# Patient Record
Sex: Female | Born: 1980 | Race: Black or African American | Hispanic: No | Marital: Single | Smoking: Current some day smoker
Health system: Southern US, Community
[De-identification: ages and names within clinical notes are randomized; demographics above are authoritative.]

## PROBLEM LIST (undated history)

## (undated) DIAGNOSIS — J189 Pneumonia, unspecified organism: Secondary | ICD-10-CM

## (undated) HISTORY — PX: FOOT SURGERY: SHX648

---
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
## 2001-12-09 ENCOUNTER — Emergency Department (HOSPITAL_COMMUNITY): Admission: EM | Admit: 2001-12-09 | Discharge: 2001-12-09 | Payer: Self-pay | Admitting: Emergency Medicine

## 2001-12-11 ENCOUNTER — Inpatient Hospital Stay (HOSPITAL_COMMUNITY): Admission: EM | Admit: 2001-12-11 | Discharge: 2001-12-15 | Payer: Self-pay | Admitting: Physical Therapy

## 2002-01-15 ENCOUNTER — Emergency Department (HOSPITAL_COMMUNITY): Admission: EM | Admit: 2002-01-15 | Discharge: 2002-01-15 | Payer: Self-pay | Admitting: Emergency Medicine

## 2002-01-15 ENCOUNTER — Encounter: Payer: Self-pay | Admitting: Emergency Medicine

## 2002-01-19 ENCOUNTER — Inpatient Hospital Stay (HOSPITAL_COMMUNITY): Admission: EM | Admit: 2002-01-19 | Discharge: 2002-01-21 | Payer: Self-pay | Admitting: *Deleted

## 2002-04-14 ENCOUNTER — Emergency Department (HOSPITAL_COMMUNITY): Admission: EM | Admit: 2002-04-14 | Discharge: 2002-04-14 | Payer: Self-pay | Admitting: Emergency Medicine

## 2002-04-17 ENCOUNTER — Inpatient Hospital Stay (HOSPITAL_COMMUNITY): Admission: EM | Admit: 2002-04-17 | Discharge: 2002-04-18 | Payer: Self-pay | Admitting: Emergency Medicine

## 2002-04-19 ENCOUNTER — Emergency Department (HOSPITAL_COMMUNITY): Admission: EM | Admit: 2002-04-19 | Discharge: 2002-04-19 | Payer: Self-pay | Admitting: Emergency Medicine

## 2002-04-25 ENCOUNTER — Encounter: Admission: RE | Admit: 2002-04-25 | Discharge: 2002-04-25 | Payer: Self-pay | Admitting: Internal Medicine

## 2003-03-18 ENCOUNTER — Other Ambulatory Visit: Admission: RE | Admit: 2003-03-18 | Discharge: 2003-03-18 | Payer: Self-pay | Admitting: *Deleted

## 2003-06-08 ENCOUNTER — Encounter: Payer: Self-pay | Admitting: Internal Medicine

## 2003-06-08 ENCOUNTER — Inpatient Hospital Stay (HOSPITAL_COMMUNITY): Admission: EM | Admit: 2003-06-08 | Discharge: 2003-06-10 | Payer: Self-pay | Admitting: Emergency Medicine

## 2003-11-04 ENCOUNTER — Emergency Department (HOSPITAL_COMMUNITY): Admission: EM | Admit: 2003-11-04 | Discharge: 2003-11-05 | Payer: Self-pay | Admitting: Emergency Medicine

## 2003-11-21 ENCOUNTER — Encounter (INDEPENDENT_AMBULATORY_CARE_PROVIDER_SITE_OTHER): Payer: Self-pay | Admitting: Specialist

## 2003-11-21 ENCOUNTER — Ambulatory Visit (HOSPITAL_COMMUNITY): Admission: RE | Admit: 2003-11-21 | Discharge: 2003-11-21 | Payer: Self-pay | Admitting: Obstetrics and Gynecology

## 2003-11-21 ENCOUNTER — Ambulatory Visit (HOSPITAL_BASED_OUTPATIENT_CLINIC_OR_DEPARTMENT_OTHER): Admission: RE | Admit: 2003-11-21 | Discharge: 2003-11-21 | Payer: Self-pay | Admitting: Obstetrics and Gynecology

## 2004-02-19 ENCOUNTER — Ambulatory Visit (HOSPITAL_COMMUNITY): Admission: RE | Admit: 2004-02-19 | Discharge: 2004-02-19 | Payer: Self-pay | Admitting: Obstetrics and Gynecology

## 2004-03-27 ENCOUNTER — Other Ambulatory Visit: Admission: RE | Admit: 2004-03-27 | Discharge: 2004-03-27 | Payer: Self-pay | Admitting: Obstetrics and Gynecology

## 2004-06-26 ENCOUNTER — Emergency Department (HOSPITAL_COMMUNITY): Admission: AC | Admit: 2004-06-26 | Discharge: 2004-06-27 | Payer: Self-pay

## 2004-08-14 ENCOUNTER — Inpatient Hospital Stay (HOSPITAL_COMMUNITY): Admission: AD | Admit: 2004-08-14 | Discharge: 2004-08-14 | Payer: Self-pay | Admitting: Obstetrics and Gynecology

## 2004-08-15 ENCOUNTER — Inpatient Hospital Stay (HOSPITAL_COMMUNITY): Admission: AD | Admit: 2004-08-15 | Discharge: 2004-08-15 | Payer: Self-pay | Admitting: Obstetrics and Gynecology

## 2004-08-16 ENCOUNTER — Inpatient Hospital Stay (HOSPITAL_COMMUNITY): Admission: AD | Admit: 2004-08-16 | Discharge: 2004-08-16 | Payer: Self-pay | Admitting: Obstetrics and Gynecology

## 2004-08-30 ENCOUNTER — Inpatient Hospital Stay (HOSPITAL_COMMUNITY): Admission: AD | Admit: 2004-08-30 | Discharge: 2004-08-30 | Payer: Self-pay | Admitting: Obstetrics & Gynecology

## 2004-09-15 ENCOUNTER — Inpatient Hospital Stay (HOSPITAL_COMMUNITY): Admission: AD | Admit: 2004-09-15 | Discharge: 2004-09-15 | Payer: Self-pay | Admitting: Obstetrics and Gynecology

## 2004-10-02 ENCOUNTER — Inpatient Hospital Stay (HOSPITAL_COMMUNITY): Admission: AD | Admit: 2004-10-02 | Discharge: 2004-10-05 | Payer: Self-pay | Admitting: Obstetrics and Gynecology

## 2004-10-04 ENCOUNTER — Encounter (INDEPENDENT_AMBULATORY_CARE_PROVIDER_SITE_OTHER): Payer: Self-pay | Admitting: Specialist

## 2004-10-06 ENCOUNTER — Encounter: Admission: RE | Admit: 2004-10-06 | Discharge: 2004-10-06 | Payer: Self-pay | Admitting: Obstetrics and Gynecology

## 2004-11-06 ENCOUNTER — Encounter: Admission: RE | Admit: 2004-11-06 | Discharge: 2004-12-06 | Payer: Self-pay | Admitting: Obstetrics and Gynecology

## 2004-12-07 ENCOUNTER — Encounter: Admission: RE | Admit: 2004-12-07 | Discharge: 2005-01-06 | Payer: Self-pay | Admitting: Obstetrics and Gynecology

## 2005-02-04 ENCOUNTER — Encounter: Admission: RE | Admit: 2005-02-04 | Discharge: 2005-03-06 | Payer: Self-pay | Admitting: Obstetrics and Gynecology

## 2005-04-06 ENCOUNTER — Encounter: Admission: RE | Admit: 2005-04-06 | Discharge: 2005-05-06 | Payer: Self-pay | Admitting: Obstetrics and Gynecology

## 2005-06-06 ENCOUNTER — Encounter: Admission: RE | Admit: 2005-06-06 | Discharge: 2005-07-06 | Payer: Self-pay | Admitting: Obstetrics and Gynecology

## 2005-07-07 ENCOUNTER — Encounter: Admission: RE | Admit: 2005-07-07 | Discharge: 2005-08-05 | Payer: Self-pay | Admitting: Obstetrics and Gynecology

## 2005-08-06 ENCOUNTER — Encounter: Admission: RE | Admit: 2005-08-06 | Discharge: 2005-09-05 | Payer: Self-pay | Admitting: Obstetrics and Gynecology

## 2005-09-06 ENCOUNTER — Encounter: Admission: RE | Admit: 2005-09-06 | Discharge: 2005-10-05 | Payer: Self-pay | Admitting: Obstetrics and Gynecology

## 2005-10-06 ENCOUNTER — Encounter: Admission: RE | Admit: 2005-10-06 | Discharge: 2005-11-05 | Payer: Self-pay | Admitting: Obstetrics and Gynecology

## 2006-03-22 ENCOUNTER — Emergency Department (HOSPITAL_COMMUNITY): Admission: EM | Admit: 2006-03-22 | Discharge: 2006-03-22 | Payer: Self-pay | Admitting: Emergency Medicine

## 2006-07-17 ENCOUNTER — Emergency Department (HOSPITAL_COMMUNITY): Admission: EM | Admit: 2006-07-17 | Discharge: 2006-07-17 | Payer: Self-pay | Admitting: Emergency Medicine

## 2006-09-06 ENCOUNTER — Ambulatory Visit (HOSPITAL_COMMUNITY): Admission: RE | Admit: 2006-09-06 | Discharge: 2006-09-06 | Payer: Self-pay | Admitting: *Deleted

## 2006-09-06 ENCOUNTER — Encounter (INDEPENDENT_AMBULATORY_CARE_PROVIDER_SITE_OTHER): Payer: Self-pay | Admitting: *Deleted

## 2006-12-23 ENCOUNTER — Encounter (INDEPENDENT_AMBULATORY_CARE_PROVIDER_SITE_OTHER): Payer: Self-pay | Admitting: *Deleted

## 2007-05-25 ENCOUNTER — Other Ambulatory Visit: Payer: Self-pay

## 2007-05-25 ENCOUNTER — Inpatient Hospital Stay (HOSPITAL_COMMUNITY): Admission: AD | Admit: 2007-05-25 | Discharge: 2007-05-30 | Payer: Self-pay | Admitting: Obstetrics and Gynecology

## 2007-05-25 ENCOUNTER — Other Ambulatory Visit: Payer: Self-pay | Admitting: Emergency Medicine

## 2007-05-31 ENCOUNTER — Ambulatory Visit: Payer: Self-pay | Admitting: Gastroenterology

## 2007-07-13 ENCOUNTER — Inpatient Hospital Stay (HOSPITAL_COMMUNITY): Admission: AD | Admit: 2007-07-13 | Discharge: 2007-07-15 | Payer: Self-pay | Admitting: Obstetrics and Gynecology

## 2007-11-27 ENCOUNTER — Inpatient Hospital Stay (HOSPITAL_COMMUNITY): Admission: AD | Admit: 2007-11-27 | Discharge: 2007-11-27 | Payer: Self-pay | Admitting: Obstetrics

## 2007-12-25 ENCOUNTER — Inpatient Hospital Stay (HOSPITAL_COMMUNITY): Admission: AD | Admit: 2007-12-25 | Discharge: 2007-12-25 | Payer: Self-pay | Admitting: Certified Nurse Midwife

## 2007-12-31 ENCOUNTER — Inpatient Hospital Stay (HOSPITAL_COMMUNITY): Admission: AD | Admit: 2007-12-31 | Discharge: 2007-12-31 | Payer: Self-pay | Admitting: Obstetrics & Gynecology

## 2008-01-06 ENCOUNTER — Inpatient Hospital Stay (HOSPITAL_COMMUNITY): Admission: AD | Admit: 2008-01-06 | Discharge: 2008-01-07 | Payer: Self-pay | Admitting: Obstetrics

## 2008-01-08 ENCOUNTER — Inpatient Hospital Stay (HOSPITAL_COMMUNITY): Admission: AD | Admit: 2008-01-08 | Discharge: 2008-01-11 | Payer: Self-pay | Admitting: Obstetrics & Gynecology

## 2008-07-31 ENCOUNTER — Emergency Department (HOSPITAL_COMMUNITY): Admission: EM | Admit: 2008-07-31 | Discharge: 2008-07-31 | Payer: Self-pay | Admitting: Emergency Medicine

## 2010-04-03 ENCOUNTER — Emergency Department (HOSPITAL_COMMUNITY): Admission: EM | Admit: 2010-04-03 | Discharge: 2010-04-04 | Payer: Self-pay | Admitting: Emergency Medicine

## 2010-06-12 ENCOUNTER — Emergency Department (HOSPITAL_COMMUNITY): Admission: EM | Admit: 2010-06-12 | Discharge: 2010-06-12 | Payer: Self-pay | Admitting: Emergency Medicine

## 2010-12-23 ENCOUNTER — Emergency Department (HOSPITAL_COMMUNITY)
Admission: EM | Admit: 2010-12-23 | Discharge: 2010-12-24 | Disposition: A | Discharge status: Home / Self Care | Payer: 59 | Attending: Emergency Medicine | Admitting: Emergency Medicine

## 2010-12-23 DIAGNOSIS — H00019 Hordeolum externum unspecified eye, unspecified eyelid: Secondary | ICD-10-CM | POA: Insufficient documentation

## 2010-12-23 DIAGNOSIS — H5789 Other specified disorders of eye and adnexa: Secondary | ICD-10-CM | POA: Insufficient documentation

## 2011-01-11 LAB — BASIC METABOLIC PANEL
BUN: 12 mg/dL (ref 6–23)
CO2: 24 mEq/L (ref 19–32)
Calcium: 8.8 mg/dL (ref 8.4–10.5)
Chloride: 109 mEq/L (ref 96–112)
Creatinine, Ser: 0.76 mg/dL (ref 0.4–1.2)
GFR calc Af Amer: >60 mL/min (ref >60)
GFR calc non Af Amer: >60 mL/min (ref >60)
Glucose, Bld: 98 mg/dL (ref 70–99)
Potassium: 3.8 mEq/L (ref 3.5–5.1)
Sodium: 137 mEq/L (ref 135–145)

## 2011-01-11 LAB — DIFFERENTIAL
Basophils Absolute: 0 10*3/uL (ref 0.0–0.1)
Basophils Relative: 0 % (ref 0–1)
Eosinophils Absolute: 0.2 10*3/uL (ref 0.0–0.7)
Eosinophils Relative: 2 % (ref 0–5)
Lymphocytes Relative: 26 % (ref 12–46)
Lymphs Abs: 2.6 10*3/uL (ref 0.7–4.0)
Monocytes Absolute: 0.6 10*3/uL (ref 0.1–1.0)
Monocytes Relative: 6 % (ref 3–12)
Neutro Abs: 6.5 10*3/uL (ref 1.7–7.7)
Neutrophils Relative %: 65 % (ref 43–77)

## 2011-01-11 LAB — CBC
HCT: 40.5 % (ref 36.0–46.0)
Hemoglobin: 13.6 g/dL (ref 12.0–15.0)
MCHC: 33.6 g/dL (ref 30.0–36.0)
MCV: 94.6 fL (ref 78.0–100.0)
Platelets: 233 10*3/uL (ref 150–400)
RBC: 4.28 MIL/uL (ref 3.87–5.11)
RDW: 13.4 % (ref 11.5–15.5)
WBC: 10 10*3/uL (ref 4.0–10.5)

## 2011-01-11 LAB — D-DIMER, QUANTITATIVE: D-Dimer, Quant: 0.23 ug/mL-FEU (ref 0.00–0.48)

## 2011-03-09 NOTE — Consult Note (Signed)
[name], HODO NO.:  [id number]   MEDICAL RECORD NO.:  [id number]          PATIENT TYPE:  INP   LOCATION:  9304                          FACILITY:  [facility]   PHYSICIAN:  [name]. Patricia Howard, MD    DATE OF BIRTH:  02/04/1981   DATE OF CONSULTATION:  05/26/2007  DATE OF DISCHARGE:                                 CONSULTATION   REASON FOR CONSULTATION:  Nausea and vomiting.   REFERRING PHYSICIAN:  Dr. Marina Gravel   HISTORY OF PRESENT ILLNESS:  This is a 30 year old female with a past  medical history of nausea and vomiting that is episodic and one elective  abortion in 2007 who was admitted to the hospital for further evaluation  and treatment of her acute nausea and vomiting.  The patient states that  her symptoms started approximately 2-3 days prior to her admission.  Initially it started with diarrhea where she would have five watery  bowel movements per day but there is no evidence of any hematochezia or  melena.  Subsequently, she has developed nausea and vomiting to the  point that she required admission.  Currently she is [redacted] weeks pregnant  and during her prior two pregnancy, one of which she delivered to full-  term, she did not have any issues with nausea and vomiting aside from  the usual morning sickness symptoms.  From the gynecologic standpoint it  does not appear that she has a molar pregnancy or any thyroid issues,  but her nausea and vomiting is quite persistent.  She reports having  subjective fever, possibly up to 101, but she is not completely certain.  In the past she has had several episodes of nausea and vomiting which  required hospitalization and spontaneous resolution of her symptoms  while in the hospital with supportive care.  Currently she has received  Phenergan and Zofran without any significant benefit.  She denies having  any prior medical history and she currently denies using alcohol at this  time.  In the past she did use alcohol  in 2003 and she felt that may  have been a cause of her severe nausea and vomiting.   PAST MEDICAL AND SURGICAL HISTORY:  As stated above.   FAMILY HISTORY:  Noncontributory.   PHYSICAL EXAMINATION:  VITAL SIGNS:  Blood pressure is 118/74, heart  rate 55, respirations 20, temperature is 99.6.  GENERAL:  The patient is ill-appearing, actively vomiting.  HEENT:  Normocephalic, atraumatic.  Extraocular muscles intact.  NECK:  Supple.  No lymphadenopathy.  LUNGS:  Clear to auscultation bilaterally.  CARDIOVASCULAR:  Regular rate and rhythm.  ABDOMEN:  Flat, soft, nontender, nondistended.  EXTREMITIES:  No clubbing, cyanosis or edema.  RECTAL:  Examination is heme negative.  No abnormalities palpated.   LABORATORY VALUES:  White blood cell count 17.0, hemoglobin 11.9, MCV is  94.3, platelets at 196.  Sodium is 135, potassium 3.1, chloride 112, CO2  20, glucose 173, BUN 3, creatinine 0.5, alk phos is 36, total bili 0.9,  AST 18, ALT 16, and albumin is 8.5.  Urinalysis is negative for  infection, positive for ketones.   IMPRESSION:  Nausea and vomiting and diarrhea.  The etiology of her  symptoms are unclear at this time.  In the initial thought was this  could be hyperemesis gravidarum but it does not appear to be the case  given her history of these exact same symptoms in 2001, 2003 and 2004.  Prior workup was negative for any clear etiology and she did respond  with supportive care using Zofran and Phenergan.  Hopefully, with  continued supportive care she will be able to improve.  Currently, her  pregnancy appears to be well at this time.  With her history of  diarrhea, it will be prudent to screen for any types of infectious  etiologies.   PLAN:  1. Continue with supportive care with Reglan, Zofran.  2. Check stool studies consisting of fecal leukocytes, culture and      sensitivity, and C. difficile.      [name] Patricia Howard, MD  Electronically Signed     PDH/MEDQ  D:   05/26/2007  T:  05/27/2007  Job:  [id number]   cc:   Gerri Spore B. Earlene Plater, M.D.  Fax: [phone]

## 2011-03-09 NOTE — H&P (Signed)
[name], [name]                   ACCOUNT NO.:  [id number]   MEDICAL RECORD NO.:  [id number]          PATIENT TYPE:  INP   LOCATION:  9304                          [facility]ACILITY[facility]  [facility]   PHYSICIAN:  [place] B. Earlene Plater, M.D.  DATE OF BIRTH:  11-11-1980   DATE OF ADMISSION:  05/25/2007  DATE OF DISCHARGE:                              HISTORY & PHYSICAL   CHIEF COMPLAINT:  Hyperemesis.   HISTORY OF PRESENT ILLNESS:  A 30 year old [place] American female, five  weeks pregnant, presents with two days of progressively increasing  nausea and vomiting and diarrhea.  At this point, she has been unable to  keep anything down solid or liquid for about the last 24 hours.  She has  persistent recurrent bouts of vomiting despite intravenous Phenergan and  Zofran at [facility].  Was transferred to [facility] for further  evaluation and treatment.   PAST MEDICAL HISTORY:  None.   PAST SURGICAL HISTORY:  Elective abortion x1.   FAMILY HISTORY:  Noncontributory.   PHYSICAL EXAM:  VITAL SIGNS:  Blood pressure 114/71, pulse 70,  respiration 16, temperature 97.0 orally.  GENERAL:  The patient is alert and oriented.  No acute distress.  Moderately ill with paling appearing.  HEENT:  Sclerae are pale.  Mucous membranes appear slightly dry.  ABDOMEN:  Soft, nontender.  Nondistended.  PELVIC:  Deferred.   LABORATORY DATA:  Complete blood count  and complete metabolic profile  within normal limits other than mild hypokalemia with potassium of 3.7.  White blood cell count mildly elevated at 17,000 with a left shift.  Ultrasound shows 5+ week intrauterine pregnancy with fetal heart tones  noted.  No adnexal masses.   ASSESSMENT:  1. Hyperemesis.  2. Pregnancy.  3. Possibly gastroenteritis.  Patient has not responded to first few      rounds of antiemetics.   PLAN:  Twenty-three hour observation.  Hyperemesis protocol.  To repeat  blood work in the morning.      Gerri Spore B. Earlene Plater, M.D.  Electronically Signed     WBD/MEDQ  D:  05/25/2007  T:  05/26/2007  Job:  [id number]

## 2011-03-12 NOTE — Discharge Summary (Signed)
[name], [name] NO.:  [id number]   MEDICAL RECORD NO.:  [id number]          PATIENT TYPE:  INP   LOCATION:  9302                          [facility]ILITY[facility]  [facility]   PHYSICIAN:  Lenoard Aden, M.D.DATE OF BIRTH:  Feb 27, 1981   DATE OF ADMISSION:  07/12/2007  DATE OF DISCHARGE:  07/15/2007                               DISCHARGE SUMMARY   Patient admitted for hyperemesis gastroenteritis.  Tolerated a regular  diet well. IV fluids given. Discharged to home on hospital day #3,  feeling better. Discharge teaching done. Antiemetics and Tylenol  recommended. Follow up in the office in one week.      Lenoard Aden, M.D.  Electronically Signed     RJT/MEDQ  D:  08/23/2007  T:  08/23/2007  Job:  [id number]

## 2011-03-12 NOTE — Op Note (Signed)
[name], [name]                             ACCOUNT NO.:  [id number]   MEDICAL RECORD NO.:  [id number]                   PATIENT TYPE:  AMB   LOCATION:  NESC                                 FACILITY:  [facility]   PHYSICIAN:  Richardean Sale, M.D.                DATE OF BIRTH:  28-Jun-1981   DATE OF PROCEDURE:  11/21/2003  DATE OF DISCHARGE:                                 OPERATIVE REPORT   PREOPERATIVE DIAGNOSIS:  Missed abortion.   POSTOPERATIVE DIAGNOSIS:  Missed abortion.   PROCEDURE:  Dilation and evacuation of the uterus.   SURGEON:  Richardean Sale, M.D.   ANESTHESIA:  Conscious sedation with paracervical block.   COMPLICATIONS:  None.   ESTIMATED BLOOD LOSS:  Minimal.   FINDINGS:  Preoperative exam under anesthesia:  Approximately eight weeks'  size retroflexed uterus, which was mobile.  Cervix not dilated.  Intraoperative findings:  Moderate amount of POC.  After procedure uterus  was small, down to less than six weeks' size, and mobile.   DESCRIPTION OF PROCEDURE:  The patient was taken to the operating room after  informed consent was obtained.  She was prepped and draped in the usual  sterile fashion and placed in the dorsal lithotomy position.  Conscious  sedation was administered per anesthesia and a paracervical block was  administered giving a total of 18 mL of 1% Nesacaine.  The anterior lip of  the cervix was grasped with a single-tooth tenaculum and the cervix was  dilated successfully with the Hegar dilators.  The #8 suction curette was  then introduced into the uterus, suction was applied, and the uterus was  emptied of products of conception.  After suction was complete, this was  followed by sharp curettage until a gritty texture was noted in all four  quadrants.  The curettings were sent along with the suction specimen to  pathology.  After sharp curettage was performed, the single-tooth tenaculum  was removed from the cervix and bimanual exam was  performed, which revealed  a now less than six weeks' size uterus, which was still retroflexed and  mobile.  There were no adnexal masses noted.  All instruments were then  removed from the patient's vagina.  She was taken out of the dorsal  lithotomy position and awakened from conscious sedation.  There were no  complications.                                               Richardean Sale, M.D.    JW/MEDQ  D:  11/21/2003  T:  11/21/2003  Job:  [id number]

## 2011-03-12 NOTE — Op Note (Signed)
NAMECAMBRIE, SONNENFELD                   ACCOUNT NO.:  [id number]   MEDICAL RECORD NO.:  [id number]          PATIENT TYPE:  AMB   LOCATION:  SDC                           [facility]ILITY:  [facility]   PHYSICIAN:  [place] B. Earlene Plater, M.D.  DATE OF BIRTH:  [birth year]   DATE OF PROCEDURE:  09/06/2006  DATE OF DISCHARGE:                                 OPERATIVE REPORT   PREOPERATIVE DIAGNOSES:  Six week pregnancy, desires termination.   POSTOPERATIVE DIAGNOSIS(ES):  Six week pregnancy, desires termination.   PROCEDURE(S):  Suctioned curettage   SURGEON:  Marina Gravel, M.D.   ASSISTANT:  None.   ANESTHESIA:  MAC and 20 mL 1% paracervical block.   SPECIMEN(S):  Product of conception to pathology.   COMPLICATIONS:  None.   INDICATIONS:  The patient with above history desires termination. Preop GC  and chlamydia were negative in the office. The patient advised of the risks  surgically including infection, bleeding, and perforation and retained  products.   PROCEDURE:  The patient taken to the operating room and MAC anesthesia  obtained.  She was placed in the [place]ina stirrups, prepped and draped in  standard fashion. The bladder was left full to facilitate ultrasound  guidance. Speculum was inserted. Paracervical block placed and cervix gently  dilated to #21. It was known to be slightly retroverted and about six weeks  size. Under ultrasound guidance the uterus was evacuated with a #7 curved  cannula. Uterus is then curetted, and a small amount of tissue returned.  Ultrasound confirmed complete evacuation. Cannula placed one more time with  no additional tissue returned. Therefore procedure was terminated.   The patient tolerated the procedure without complication. She was taken to  the recovery room in stable condition.      Gerri Spore B. Earlene Plater, M.D.  Electronically Signed     WBD/MEDQ  D:  09/06/2006  T:  09/06/2006  Job:  [id number]

## 2011-03-12 NOTE — Discharge Summary (Signed)
[name], [name]                             ACCOUNT NO.:  [id number]   MEDICAL RECORD NO.:  [id number]                   PATIENT TYPE:  INP   LOCATION:  0356                                 FACILITY:  [facility]   PHYSICIAN:  Deirdre Peer. Polite, M.D.              DATE OF BIRTH:  [birth year]   DATE OF ADMISSION:  06/07/2003  DATE OF DISCHARGE:                                 DISCHARGE SUMMARY   DISCHARGE DIAGNOSES:  1. Viral gastroenteritis.  2. Fever, resolved at discharge.  3. Nausea and vomiting secondary to #1.  4. Dehydration secondary to #1.  5. Mildly elevated CPK secondary to excessive retching.   DISCHARGE MEDICATIONS:  1. Prilosec 20 mg one p.o. daily.  2. Phenergan 25 mg q.4-6h. p.r.n.  3. Cipro 500 mg p.o. b.i.d. x2 days.   The patient is asked to avoid NSAIDS.   FOLLOW-UP:  The patient is asked to follow up with doctor at [facility].   STUDIES:  The patient had an abdominal ultrasound, essentially normal.  UA  negative for bacteria, greater than 80 ketones, 100 protein.  CK of 890; MB  index, troponin I negative.  Amylase and lipase of 67 and 24 respectively.  LFTs within normal limits.  TSH 1.8.  Serum H. pylori antibody within normal  limits at 0.80.   HISTORY OF PRESENT ILLNESS:  A 30 year old black female, no significant past  medical history, presented to the ED with complaints of nausea and vomiting  for two to three days and unable to keep food down.  Yesterday she had one  episode of diarrhea.  The patient denies any unusual intakes.  No recent  antibiotics, no well water, no [place] food, no other sick contacts.  Of  note, the patient did have a similar illness approximately one year ago  which resolved with conservative treatment.  Because of the patient's  profound dehydration, admission was deemed necessary for further evaluation  and treatment.  Please see dictated H&P for history of present illness.   PAST MEDICAL HISTORY:  As stated above,  none.   MEDICATIONS ON ADMISSION:  Included Prilosec and hydroxizine p.r.n.   ALLERGIES:  None.   PAST SURGICAL HISTORY:  None.   SOCIAL HISTORY:  Negative for tobacco, alcohol, or drugs.   FAMILY HISTORY:  Noncontributory.   HOSPITAL COURSE:  The patient was admitted to a medical floor bed for  evaluation and treatment of viral gastroenteritis characterized by nausea  and vomiting, inability to keep down p.o. intake.  Also had associated fever  and lethargy.  The patient was treated with empiric antibiotics in the form  of Cipro as well as aggressive fluid resuscitation.  During this  hospitalization the patient had occasional bouts of emesis but significantly  reduced from what she described prior to admission.  On hospital day #2 the  patient stated that she felt dramatically better and  wanted to try p.o.  intake which she tolerated without any emesis.  That same day she stated  that she felt so much better that she was ready to go home, did not think  that she needed any other studies, and felt that she could titrate her own  diet at home.  At this time the patient is afebrile, hemodynamically stable,  tolerating all clear, and desires to be discharged to home.  At this time  the patient will be discharged to home, advised to follow up at [facility], take medications as stated in the medication section.                                               Deirdre Peer. Polite, M.D.    RDP/MEDQ  D:  06/10/2003  T:  06/10/2003  Job:  [id number]
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
## 2011-03-12 NOTE — Consult Note (Signed)
NAMEMARKIYAH, GAHM NO.:  [id number]   MEDICAL RECORD NO.:  [id number]          PATIENT TYPE:  MAT   LOCATION:  MATC                          FACILITY:  [facility]   PHYSICIAN:  Lenoard Aden, M.D.DATE OF BIRTH:  [birth year]   DATE OF CONSULTATION:  09/15/2004  DATE OF DISCHARGE:                                   CONSULTATION   CHIEF COMPLAINT:  Rule out labor.   HISTORY OF PRESENT ILLNESS:  The patient is a 30 year old white female, G3,  P0, EDD October 19, 2004 at 35 1/7 weeks who presents with increased  frequency of contractions.  Medications include Procardia and prenatal  vitamins, history of SAB with D&C x1 and SAB without D&C x1. No other  medical or surgical hospitalizations.   FAMILY HISTORY:  Drug addiction, myocardial infarction, congestive heart  failure and thrombophlebitis.   PRENANCY COURSE:  Complicated by prenatal change.   PRENATAL LAB DATA:  Blood type O positive, rubella immune, hepatitis  negative, HIV declined. GBS is positive.   PHYSICAL EXAMINATION:  VITAL SIGNS:  Stable.  The patient's afebrile.  HEENT:  Normal.  LUNGS:  Clear.  HEART:  Regular rate and rhythm.  ABDOMEN:  Soft, gravid, nontender.  Estimated fetal weight 5.5 to 6 pounds,  cervix is 1-2 cm, 2 cm long, vertex, -1 to 0 station.  EXTREMITIES:  Reveals no cords.  NEUROLOGIC:  Nonfocal.   NST is reactive. Contractions are irregular.   IMPRESSION:  1.  35 week OB.  2.  Preterm labor, minimal evidence of preterm change.   PLAN:  Monitor closely, continue Procardia.  Attempts at tocolysis discussed  with the patient and her mother who is in attendance today.  If continuous  contractions are noted and patient does not stop on Procardia and  terbutaline will  admit for observation and possible delivery.  If p.o.  tocolysis or subcu terbutaline is successful will continue to monitor and  probable discharge home.     RJT/MEDQ  D:  09/15/2004  T:  09/15/2004  Job:   [id number]

## 2011-03-12 NOTE — H&P (Signed)
[place]. [facility]  Patient:    [name], [name] Visit Number: [id number] MRN: [id number]          Service Type: MED Location: [phone] Attending Physician:  Tobin Chad Dictated by:   Arlis Porta, M.D. Admit Date:  04/16/2002                           History and Physical  CHIEF COMPLAINT:  Vomiting and abdominal pain.  HISTORY OF PRESENT ILLNESS: The patient is a 30 year old African-American female who began vomiting on Thursday and has not stopped since.  She states that she has been vomiting every half an hour to one hour.  She describes he vomitus as non-bloody.  She does describe some mucous greenish color to it, however. She also did have some diarrhea one to two times in the beginning of her course described as watery but non-bloody.  The patient denies any melena. She denies any mucous in her stools. Associated with all of this, she has also had some abdominal pain that started today described as burning and constant, rated 7 out of 10. The vomiting makes it worse.  The patient was seen in the emergency room on Saturday and was sent home after giving intravenous fluids and some Phenergan, but she has not improved.  The patient states that she has lost a total of 10 to 15 pounds going from 125 to 110 pounds today. She denies any dysphagia.  REVIEW OF SYSTEMS: RESPIRATORY:  The patient denies any shortness of breath. CARDIAC:  The patient does have chest pain with vomiting.  GU:  The patient denies any dysuria or hematuria.  GENERAL:  The patient denies any sick contacts.  The patient denies recent travel.  She denies recent antibiotics. Positive fevers ranging between 99.0 to 101.4 at home.  Positive upper respiratory infection a week and a half ago.  HEENT:  Positive sinus congestion.  PAST MEDICAL HISTORY: 1. 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 2. The patient was admitted in March 2003 for the same,  alcoholic gastritis. 3. The patient denies any surgeries. 4. Meningitis as a child.  MEDICATIONS: 1. Phenergan p.r.n. 2. Tylenol p.r.n. 3. Denies any herbal supplements.  ALLERGIES:  No known drug allergies.  SOCIAL HISTORY: The patient lives with a roommate and she goes to school at [employer]. She denies any tobacco use.  She denies any alcohol use since her last admission. She denies any other drugs.  The patient currently works at [employer].  FAMILY HISTORY: The patients father has a history of diabetes.  The patients mother has a questionable history of some sort of colitis for which she says that her religion has healed her. The patient does have a brother who is alive and well. The family denies any history of liver, stomach or colon cancer. They deny any history of inflammatory bowel disease.  PHYSICAL EXAMINATION:  VITAL SIGNS:  Temperature 101.1, pulse 60, blood pressure 135/69, respiratory rate 20, 98% on room air.  GENERAL:  She is a thin African-American female in no acute distress.  She is present with both her parents.  HEENT:  Pupils equal, round and reactive to light.  Extraocular movements are intact.  Normocephalic, atraumatic.  Tympanic membranes are occluded bilaterally by wax.  Oropharynx is without erythema or exudates.  There is no scleral icterus or conjunctivitis.  Neck exam:  Shotty lymph nodes bilaterally.  LUNGS:  Clear to auscultation bilaterally.  HEART:  Regular rate and rhythm with no murmurs.  ABDOMEN:  Soft and nondistended.  Positive tenderness to palpation at the epigastrium.  No hepatomegaly. No rebound or guarding.  Normal acute bowel sounds.  EXTREMITIES:  She moves all extremities well.  She has 2+ distal pulses and no edema.  Capillary refill is less then two seconds.  RECTAL EXAM:  The patient had normal tone, an empty vault, guaiac negative.  LABORATORY DATA: 1. CBC with differential showed a white blood cell count  of 9.4, hemoglobin    14.2, hematocrit 41.7, platelet count 266,000 with a differential showing    76% neutrophils, 13% lymphocytes, 9% monocytes, ANC 7.2. 2. I-stat H:  Sodium 138, potassium 2.8, chloride 102, C02 21, BUN 13,    glucose 111. 3. Urinalysis shows a specific gravity of 1.012, 15 ketones, 30 protein,    negative leukocyte esterase or nitrites. 4. Urine pregnancy test was negative.  ASSESSMENT AND PLAN:  The patient is a 30 year old African-American female with intractable vomiting and dehydration.  #1 - VOMITING:  The most likely causes include gastroenteritis or possibly even alcoholic gastritis given her past medical history.  Gastroenteritis could be likely given her recent fevers, shotty lymphadenopathy and previous diarrhea.  Other etiologies that are less likely include pancreatitis, pregnancy, appendicitis, small bowel obstruction, diabetic ketoacidosis, inflammatory bowel disease or CNS disorders given her history, physical and laboratory data.  For now, we will check an alcohol level and a urine drug screen. We will treat her symptomatically with Phenergan and IV fluids.  We may consider Zofran if there is no improvement.  We will start her on a clear diet.  We will start a proton pump inhibitor b.i.d. Record strict Is and Os. Check liver function tests.  If her diarrhea persists, we could check some stool studies.  If the patient does not improve overall, we could consider imaging or a GI consult.  #2 - FLUIDS, ELECTROLYTES AND NUTRITION:  She is already status post four liters of IV fluids given in the ED. We will therefore just monitor her at maintenance with D5 half normal saline at 125 cc per hour with KCl supplementation.  We will recheck a C-met.  Clear liquid diet and strict Is and Os as above. Dictated by:   Arlis Porta, M.D. Attending Physician:  Tobin Chad DD:  04/17/02 TD:  04/17/02 Job: [id number]

## 2011-03-12 NOTE — H&P (Signed)
NAMECYLEIGH, MASSARO NO.:  [id number]   MEDICAL RECORD NO.:  [id number]                   PATIENT TYPE:   LOCATION:                                       FACILITY:   PHYSICIAN:  Richardean Sale, M.D.                DATE OF BIRTH:  1981-05-31   DATE OF ADMISSION:  DATE OF DISCHARGE:                                HISTORY & PHYSICAL   PREOPERATIVE DIAGNOSES:  Missed abortion __________ .   HISTORY OF PRESENT ILLNESS:  This is a 30 year old, gravida 2 para 0-0-1-0,  found to have no cardiac activity on ultrasound on November 19, 2003.  Ultrasound revealed a six week size fetal pole with a collapsed gestational  sack and no cardiac activity noted.  The patient is ten week by last  menstrual period of September 10, 2003.  The patient has had vaginal  bleeding, only some mild lower pelvic cramping noted.  The patient presents  for D&E.   PAST MEDICAL HISTORY:  None.   PAST SURGICAL HISTORY:  None.   MEDICATIONS:  Prenatal vitamins.   ALLERGIES:  No known drug allergies.   SOCIAL HISTORY:  No tobacco as of October 26, 2003.  No alcohol or drugs.   FAMILY HISTORY:  No history of bleeding disorders or coagulopathy.   PHYSICAL EXAMINATION:  VITAL SIGNS:  She is afebrile.  Her vital signs are  stable.  GENERAL:  She is a well-developed, well-nourished, black female in no  apparent distress.  NECK:  Neck and thyroid are within normal limits.  LUNGS:  Clear to auscultation bilaterally.  HEART:  Regular rate and rhythm.  No murmurs, rubs or gallops.  ABDOMEN:  Soft, nontender, nondistended.  No palpable masses noted.  Liver  and spleen are normal.  EXTERNAL GENITALIA:  Within normal limits.  PELVIC:  Vagina:  There is some white discharge present which is consistent  with yeast vaginitis on wet prep.  The cervix is not dilated.  There is no  bleeding coming from the os.  Bi-manual exam:  The uterus is approximately  eight weeks size, nontender,  retroflexed per the nurse practitioner.  There  are no adnexal masses noted as well.  Ultrasound findings as above.  EXTREMITIES:  No edema.  Nontender.   ASSESSMENT:  A 30 year old, gravida 2 para 0-0-1-0, black female with six  week missed abortion.   PLAN:  Discussed with the patient options and expected management of  surgical intervention with D&E.  The patient would like to proceed with D&E.  The risks of the procedure including but not limited to hemorrhage  requiring transfusion, perforation requiring additional surgery, infection  requiring prolonged hospitalization, possibility of Asherman syndrome or  scarring resulting in difficulty conceiving in the future and anesthetic  complications and death were reviewed with the patient.  Informed consent  was obtained.  Richardean Sale, M.D.    JW/MEDQ  D:  11/19/2003  T:  11/19/2003  Job:  [id number]

## 2011-03-12 NOTE — H&P (Signed)
[name], [name]                             ACCOUNT NO.:  [id number]   MEDICAL RECORD NO.:  [id number]                   PATIENT TYPE:  INP   LOCATION:  0356                                 FACILITY[facility]  [facility]   PHYSICIAN:  Georgann Housekeeper, M.D.                 DATE OF BIRTH:  1981/07/31   DATE OF ADMISSION:  06/07/2003  DATE OF DISCHARGE:                                HISTORY & PHYSICAL   CHIEF COMPLAINT:  Vomiting and weak.   A 30 year old female who came into the ER vomiting for the last two or three  days, unable to keep anything down.  She started having diarrhea episode  once and then the vomiting starting.  She is just throwing up bilious stuff,  and every time she drinks or eats she throws back up even liquids.  She was  able to drink a little bit of water.  Not able to urinate for very much, and  very dark urine.  Has not vomited any blood.  No abdominal pain but feels  sore after throwing up.  Has not eaten any unusual foods.  No recent  antibiotics, no recent non-steroidals.  No history of any abdominal  problems.  She had a similar episode a year ago where she was admitted and  got IV fluids.  She had had some Phenergan suppositories and Prilosec from  last year and took that without any benefit.  In the ER, she said she was  feeling weak and tired.   PAST MEDICAL HISTORY:  None.   MEDICATIONS:  She has had Prilosec and Hydroxizine she was using as needed.   ALLERGIES:  None.   PAST SURGICAL HISTORY:  None.   SOCIAL HISTORY:  No tobacco, no alcohol, no recreational drugs.  She works  as a [employer].  Also going to go back to school at  [employer].   FAMILY HISTORY:  Father diabetic.  Her mother has Crohn's disease.   REVIEW OF SYSTEMS:  No cough, no headaches, no rash or joint complaints, no  back pain.   EXAM:  VITAL SIGNS:  Temperature of 100.5, blood pressure 137/88, pulse 55,  100% sat.  GENERAL:  A young female, alert, feels weak, lying  in bed.  LUNGS:  Clear.  HEENT:  Oropharynx:  Mucous membranes were very dry.  Sclerae were  nonicteric.  NECK:  Supple, no adenopathy.  CARDIAC:  Regular S1 and S2 without any murmurs.  LUNGS:  Clear.  ABDOMEN:  Soft.  Positive bowel sounds without any tenderness.  No  organomegaly.  EXTREMITIES:  No edema.  SKIN:  No rashes.   LAB DATA:  Pregnancy test was negative.  UA shows specific gravity greater  then 1.04, positive for ketones, no white cells or bacteria.  White count of  8.8, hemoglobin 15.  Chemistries significant for a potassium of 3, a  BUN of  12, creatinine 0.8, glucose 78.  LFTs normal.  CPK was 812,  Troponin 0.02.   IMPRESSION:  A 30 year old white female with no significant past medical  history, with two to three days of intractable vomiting.  Normal blood work  except for elevated CPK.  Mildly hypokalemic.  1. Vomiting, acute gastroenteritis.  Rule out any abdominal etiology,     gallbladder disease.  We will get an ultrasound.  Admit for IV fluids,     hydration, n.p.o., and start clear liquids in the morning.  Zofran p.r.n.     for nausea.  2. Elevated CK.  Question regarding dehydration and vomiting.  Will follow     up.  She has no muscle tenderness.                                               Georgann Housekeeper, M.D.    KH/MEDQ  D:  06/08/2003  T:  06/08/2003  Job:  [id number]

## 2011-03-12 NOTE — Discharge Summary (Signed)
[place]. [facility]  Patient:    [name], [name] Visit Number: [id number] MRN: [id number]          Service Type: MED Location: [phone] Attending Physician:  Katy Apo. Dictated by:   Gracelyn Nurse, M.D. Admit Date:  01/18/2002 Discharge Date: 01/21/2002                             Discharge Summary  DISCHARGE DIAGNOSES: 1. Nausea and vomiting. 2. Viral gastroenteritis. 3. Depression. 4. Alcohol abuse.  DISCHARGE MEDICATIONS:  None.  HISTORY OF PRESENT ILLNESS:  This is a 30 year old white female with a history of alcohol abuse who comes into the emergency department with a complaint of six days of nausea and vomiting.  Says she cannot keep anything down.  No evidence of vomiting blood or bright red blood per rectum or dark, tarry stools.  Some mild epigastric pain.  The patient has had poor p.o. intake since this started.  She has consumed no alcohol since it started.  HOSPITAL COURSE:  #1 -  NAUSEA AND VOMITING:  The patient had no more episodes of vomiting after hospitalized.  Had become very nauseated and had to receive IV Phenergan on several occasions, the last being the day before discharge. She was treated with IV fluids and given potassium supplement.  The day before discharge she was started on a clear liquid diet and began to tolerate this. IV fluids were stopped.  She had advanced to a soft diet by the day of discharge with no more nausea or vomiting.  She was discharged in stable condition and advised to remain on soft diet and to advance as tolerated and to follow up with her primary care physician if needed.  #2 -  HISTORY OF ALCOHOL ABUSE:  The patient was counseled on ADS services if she so desired.  #3 -  DEPRESSION:  Currently on no medications.  Was advised to follow up with her primary care physician.  DISCHARGE LABORATORY DATA:  Sodium 134, potassium 3.4, chloride 94, CO2 23, BUN 13, creatinine 0.9, glucose 95.   White blood cell count 11.1, hemoglobin 17.9, and platelets 284. Dictated by:   Gracelyn Nurse, M.D. Attending Physician:  Renford Dills D. DD:  01/21/02 TD:  01/22/02 Job: [id number]
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
## 2011-03-12 NOTE — Discharge Summary (Signed)
[place]. [facility]  Patient:    [name], [name] Visit Number: [id number] MRN: [id number]          Service Type: EMS Location: [place] Attending Physician:  Devoria Albe Dictated by:   Vear Clock, M.D. Admit Date:  04/19/2002 Disc. Date: 04/18/02                             Discharge Summary  PRIMARY PHYSICIAN:  Physician unassigned. The patient will follow up with Dr. Jonah Blue at [facility].  DISCHARGE DIAGNOSES: 1. Nausea and vomiting secondary to gastroenteritis versus irritable bowel    syndrome versus stress. 2. Dehydration resolved.  DISCHARGE MEDICATIONS: 1. Vistaril 25 mg 1 to 2 tablets p.o. q.6-8h. p.r.n. nausea and vomiting. 2. Prevacid 30 mg p.o. q.d.  FOLLOWUP:  The patient is to follow up with Dr. Jonah Blue at [facility] on Tuesday, April 25, 2002, at 1:30 p.m. Will consider referral to GI as an outpatient.  PROCEDURES AND DIAGNOSTIC STUDIES:  None. Please see see dictated history and physical for further details.  HOSPITAL COURSE:  #1 - NAUSEA AND VOMITING:  The patient has been admitted for nausea and vomiting several times over the recent past. She did present with a four day history of nonbloody emesis. She received IV fluids and Phenergan in the emergency room on Saturday prior to admission, but the patient had ongoing nausea and vomiting after discharge and returned for admission. The patient did very well, in that she was able to tolerate p.o. intake and resolved with her emesis prior to discharge. The patient was initially treated with Phenergan, which she said was not useful for her. The patient was changed to Zofran. Prior to discharge the patient was changed to Vistaril, as Zofran is a very expensive medicine and the patient is self pay. The patient was started on Prevacid and will be continued on this as an outpatient.  #2 - PSYCHIATRIC:  The  patient does note that she internalizes her stress. She is very close with her family and is very religious, but does have a certain amount of anxiety, which may also be served by the Vistaril.  DISCHARGE LABORATORY DATA:  Sodium 143, potassium 4.1, chloride 110, CO2 25, glucose 94, BUN 4, creatinine 1.1, calcium 9.0. Urine drug screen positive for marijuana which the patient acknowledges using approximately two weeks ago. Alcohol level less than 5. Bilirubin 0.7, AP 48, AST 19, ALT 17, total protein 6.2, albumin 3.3. Lipase 22. Urinalysis 15 ketones, 30 protein, otherwise negative. Beta HCG negative. Dictated by:   Vear Clock, M.D. Attending Physician:  Devoria Albe DD:  04/18/02 TD:  04/20/02 Job: [id number]

## 2011-03-12 NOTE — Discharge Summary (Signed)
NAMETANGI, SHROFF                   ACCOUNT NO.:  [id number]   MEDICAL RECORD NO.:  [id number]          PATIENT TYPE:  INP   LOCATION:  9304                          FACILITY:  [facility]   PHYSICIAN:  [place] B. Earlene Plater, M.D.  DATE OF BIRTH:  1981-02-27   DATE OF ADMISSION:  05/25/2007  DATE OF DISCHARGE:  05/30/2007                               DISCHARGE SUMMARY   ADMISSION DIAGNOSES:  1. A 5-week intrauterine pregnancy.  2. Hyperemesis.   DISCHARGE DIAGNOSES:  1. A 5-week intrauterine pregnancy.  2. Hyperemesis.   HISTORY OF PRESENT ILLNESS:  A 30 year old African-American female, [redacted]  weeks pregnant, who had a 2-day history of progressively worsening  nausea, vomiting and diarrhea.  The patient had been unable to keep any  liquid down for 24 hours.  She initially presented to [facility]; was  given IV fluids and Phenergan and Zofran without relief.  She was  transferred to [facility] for further evaluation and treatment.   HOSPITAL COURSE:  The patient was admitted at [facility].  A  complete metabolic profile was normal, other than mild hypokalemia.  Complete blood count showed mildly elevated white blood cell count;  otherwise no evidence for infection.  Her thyroid was normal.  Ultrasound showed a viable pregnancy, consistent with dates and no  evidence for molar pregnancy.  Given the complexity and severity of  symptoms, a GI consult was required and obtained by Dr. Theda Belfast.  Ultimately impression was probable hyperemesis only, and no other GI  cause of the diarrhea.   Ultimately, by the fifth hospital day the patient's emesis had improved  to the point she was able to keep liquids and solids down, and diarrhea  had essentially resolved.  The patient was subsequently discharged to  home.   FOLLOWUP:  Office in one week.   DISCHARGE INSTRUCTIONS:  Notify if any worsening of symptoms.  She was  offered home IV therapy, but refused.   DISCHARGE MEDICATIONS:  1.  Phenergan 25 mg p.o. q.6 h. p.r.n. nausea.  2. Zofran 8 mg p.o. q.12 h. p.r.n. nausea.   DISPOSITION AT DISCHARGE:  Improved.      Gerri Spore B. Earlene Plater, M.D.  Electronically Signed     WBD/MEDQ  D:  06/20/2007  T:  06/20/2007  Job:  [id number]

## 2011-03-23 ENCOUNTER — Emergency Department (HOSPITAL_COMMUNITY)
Admission: EM | Admit: 2011-03-23 | Discharge: 2011-03-23 | Disposition: A | Discharge status: Other Acute Inpatient Hospital | Payer: 59 | Attending: Emergency Medicine | Admitting: Emergency Medicine

## 2011-03-23 ENCOUNTER — Inpatient Hospital Stay (HOSPITAL_COMMUNITY)
Admission: AD | Admit: 2011-03-23 | Discharge: 2011-03-26 | DRG: 881 | Disposition: A | Discharge status: Home / Self Care | Payer: 59 | Source: Ambulatory Visit | Attending: Psychiatry | Admitting: Psychiatry

## 2011-03-23 DIAGNOSIS — F329 Major depressive disorder, single episode, unspecified: Secondary | ICD-10-CM | POA: Insufficient documentation

## 2011-03-23 DIAGNOSIS — T400X1A Poisoning by opium, accidental (unintentional), initial encounter: Secondary | ICD-10-CM

## 2011-03-23 DIAGNOSIS — T398X2A Poisoning by other nonopioid analgesics and antipyretics, not elsewhere classified, intentional self-harm, initial encounter: Secondary | ICD-10-CM | POA: Insufficient documentation

## 2011-03-23 DIAGNOSIS — F1210 Cannabis abuse, uncomplicated: Secondary | ICD-10-CM | POA: Insufficient documentation

## 2011-03-23 DIAGNOSIS — T394X2A Poisoning by antirheumatics, not elsewhere classified, intentional self-harm, initial encounter: Secondary | ICD-10-CM

## 2011-03-23 DIAGNOSIS — F3289 Other specified depressive episodes: Secondary | ICD-10-CM | POA: Insufficient documentation

## 2011-03-23 LAB — COMPREHENSIVE METABOLIC PANEL
ALT: 15 U/L (ref 0–35)
AST: 21 U/L (ref 0–37)
Albumin: 3.8 g/dL (ref 3.5–5.2)
Alkaline Phosphatase: 57 U/L (ref 39–117)
BUN: 7 mg/dL (ref 6–23)
CO2: 24 mEq/L (ref 19–32)
Calcium: 8.7 mg/dL (ref 8.4–10.5)
Chloride: 103 mEq/L (ref 96–112)
Creatinine, Ser: 0.7 mg/dL (ref 0.4–1.2)
GFR calc Af Amer: >60 mL/min (ref >60)
GFR calc non Af Amer: >60 mL/min (ref >60)
Glucose, Bld: 99 mg/dL (ref 70–99)
Potassium: 3.3 mEq/L — ABNORMAL LOW (ref 3.5–5.1)
Sodium: 138 mEq/L (ref 135–145)
Total Bilirubin: 0.7 mg/dL (ref 0.3–1.2)
Total Protein: 6.6 g/dL (ref 6.0–8.3)

## 2011-03-23 LAB — PREGNANCY, URINE: Preg Test, Ur: NEGATIVE

## 2011-03-23 LAB — URINALYSIS, ROUTINE W REFLEX MICROSCOPIC
Bilirubin Urine: NEGATIVE
Glucose, UA: NEGATIVE mg/dL
Hgb urine dipstick: NEGATIVE
Ketones, ur: NEGATIVE mg/dL
Nitrite: NEGATIVE
Protein, ur: NEGATIVE mg/dL
Specific Gravity, Urine: 1.018 (ref 1.005–1.030)
Urobilinogen, UA: 0.2 mg/dL (ref 0.0–1.0)
pH: 6.5 (ref 5.0–8.0)

## 2011-03-23 LAB — DIFFERENTIAL
Basophils Absolute: 0 10*3/uL (ref 0.0–0.1)
Basophils Relative: 0 % (ref 0–1)
Eosinophils Absolute: 0.2 10*3/uL (ref 0.0–0.7)
Eosinophils Relative: 2 % (ref 0–5)
Lymphocytes Relative: 18 % (ref 12–46)
Lymphs Abs: 1.8 10*3/uL (ref 0.7–4.0)
Monocytes Absolute: 0.6 10*3/uL (ref 0.1–1.0)
Monocytes Relative: 6 % (ref 3–12)
Neutro Abs: 7.5 10*3/uL (ref 1.7–7.7)
Neutrophils Relative %: 75 % (ref 43–77)

## 2011-03-23 LAB — CBC
HCT: 42.2 % (ref 36.0–46.0)
Hemoglobin: 14 g/dL (ref 12.0–15.0)
MCH: 29.8 pg (ref 26.0–34.0)
MCHC: 33.2 g/dL (ref 30.0–36.0)
MCV: 89.8 fL (ref 78.0–100.0)
Platelets: 231 10*3/uL (ref 150–400)
RBC: 4.7 MIL/uL (ref 3.87–5.11)
RDW: 14.1 % (ref 11.5–15.5)
WBC: 10 10*3/uL (ref 4.0–10.5)

## 2011-03-23 LAB — RAPID URINE DRUG SCREEN, HOSP PERFORMED
Amphetamines: NOT DETECTED
Barbiturates: NOT DETECTED
Benzodiazepines: NOT DETECTED
Cocaine: NOT DETECTED
Opiates: NOT DETECTED
Tetrahydrocannabinol: POSITIVE — AB

## 2011-03-23 LAB — SALICYLATE LEVEL: Salicylate Lvl: <2 mg/dL — ABNORMAL LOW (ref 2.8–20.0)

## 2011-03-23 LAB — ETHANOL: Alcohol, Ethyl (B): <11 mg/dL — ABNORMAL HIGH (ref 0–10)

## 2011-03-23 LAB — ACETAMINOPHEN LEVEL: Acetaminophen (Tylenol), Serum: <15 ug/mL (ref 10–30)

## 2011-03-24 DIAGNOSIS — F329 Major depressive disorder, single episode, unspecified: Secondary | ICD-10-CM

## 2011-03-26 DIAGNOSIS — F429 Obsessive-compulsive disorder, unspecified: Secondary | ICD-10-CM

## 2011-03-26 DIAGNOSIS — F4010 Social phobia, unspecified: Secondary | ICD-10-CM

## 2011-03-26 DIAGNOSIS — F339 Major depressive disorder, recurrent, unspecified: Secondary | ICD-10-CM

## 2011-03-26 DIAGNOSIS — F411 Generalized anxiety disorder: Secondary | ICD-10-CM

## 2011-03-26 NOTE — H&P (Signed)
[name], [name] NO.:  [id number]  MEDICAL RECORD NO.:  [id number]           PATIENT TYPE:  I  LOCATION:  0502                          FACILITY:  [facility]  PHYSICIAN:  Franchot Gallo, MD     DATE OF BIRTH:  1981/04/12  DATE OF ADMISSION:  03/23/2011 DATE OF DISCHARGE:                      PSYCHIATRIC ADMISSION ASSESSMENT   IDENTIFYING INFORMATION:  A 30 year old African American female.  This is a voluntary admission.  HISTORY OF THE PRESENT ILLNESS:  First inpatient psychiatric admission for [name] who presents after overdosing on approximately 13 tabs of tramadol of unknown milligrams.  She reported in the emergency room that she did not care if she lived or died.  After the overdose, she called her mother because she wanted to ensure that her mother would take good care of her 16-year-old daughter.  Her mother brought her to the emergency room.  No known history of substance abuse.  She is not willing to discuss any stressors at this time and her history is taken primarily from the record.  PAST PSYCHIATRIC HISTORY:  First known inpatient psychiatric admission. No current outpatient treatment is known.  No history of previous suicide attempts.  SOCIAL HISTORY:  Single African American female with a 58-year-old daughter.  She reports she is employed full time in the [employer].  FAMILY HISTORY:  Not available.  MEDICAL HISTORY:  Primary care provider is unknown.  MEDICAL PROBLEMS:  Post tramadol overdose.  CURRENT MEDICATIONS:  None.  The tramadol was not hers.  DRUG ALLERGIES:  NONE.  PHYSICAL EXAM:  Was done in the emergency room where her urine drug screen was noted to be positive for cannabis, negative for all other substances.  Salicylate level less than .  Acetaminophen level negative. Urinalysis unremarkable.  Urine pregnancy test negative.  Alcohol screen negative.  Chemistry normal with BUN 7, creatinine 0.70.  CBC normal with a  hemoglobin of 14.  MENTAL STATUS EXAM:  Fully alert female, reclusive to her room, bundled up in the covers.  Eye contact is minimal.  She is oriented to person, place, and situation.  Complaining of being sleepy.  Refusing to attend group therapy.  Does admit that she has been depressed but does not want to talk about her stressors.  Thinking is generally logical and she is fully oriented today.  AXIS I:  Rule out depressive disorder, NOS. AXIS II:  Deferred. AXIS III:  Post tramadol overdose. AXIS IV:  Deferred. AXIS V:  Current 40, past year not known.  PLAN:  Voluntarily admit her with a goal of evaluating her mental status and alleviating her suicidal thoughts.  We are going to start her on Lexapro 5 mg daily and we have encouraged her to attend groups.  We hope to get her mother involved in treatment.  ESTIMATED LENGTH OF STAY:  Three to 4 days.     Margaret A. Lorin Picket, N.P.   ______________________________ Franchot Gallo, MD    MAS/MEDQ  D:  03/24/2011  T:  03/24/2011  Job:  [id number]  Electronically Signed by Kari Baars N.P. on 03/26/2011 08:35:30 AM Electronically Signed  by Franchot Gallo MD on 03/26/2011 10:05:17 PM

## 2011-03-30 ENCOUNTER — Ambulatory Visit (HOSPITAL_COMMUNITY): Payer: 59 | Admitting: Marriage and Family Therapist

## 2011-03-31 NOTE — Discharge Summary (Signed)
[name], [name] NO.:  [id number]  MEDICAL RECORD NO.:  [id number]  LOCATION:  0502                          FACILITY:  [facility]  PHYSICIAN:  Patricia Gallo, MD     DATE OF BIRTH:  1981-02-06  DATE OF ADMISSION:  03/23/2011 DATE OF DISCHARGE:  03/26/2011                              DISCHARGE SUMMARY   REASON FOR ADMISSION:  This was a 30 year old female admitted after overdosing on approximately 13 tabs of tramadol.  Reported that she did not care if she lived or died.  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 Axis II:  Deferred. Axis III:  Post tramadol overdose. Axis IV:  Psychosocial stressors related to custody of her 47-year-old daughter. Axis V:  55.  PERTINENT LABS:  Acetaminophen level was negative.  Urinalysis unremarkable.  Urine pregnancy test negative.  Alcohol negative.  CBC: Hemoglobin of 14.  Urine drug screen is positive for cannabis.  SIGNIFICANT FINDINGS:  This is a fully alert female, reclusive to her room, huddled in the covers.  She had minimal eye contact.  Oriented to person, place and situation.  Reporting depression but did not want to talk about her stressors.  She was admitted on the adult milieu to the mood disorder group and we addressed her substance use.  We initiated Lexapro to aid with her depressive symptoms.  Patient did not attend group because she states her anxiety was too high and could not benefit from listing to other people telling their heartbreaking stories.  On May 30, there was a situation where the patient became agitated when she was in line for dinner where a female peer approached her and asked her her name.  The patient became demanding, wanting to be discharged, upset about her social anxiety that she could not handle a stranger asking her name, had contacted her mother; she wanted her mother to come pick her up for discharge.  The patient was informed she could not be  discharged at that time but was offered a 72-hour discharge and discharge of the patient was discussed with the patient.  The patient stormed out of her room and threw her bag of belongings down the hall. On May 31, the patient reported her sleep was down, her appetite was decreased, she was having moderate depressive symptoms rating it a 5 on a scale of 1 to 10. Denied any suicidal thoughts or homicidal thoughts but was having crying spells and reported a social phobia.  We increased her Lexapro to help with her depression, anxiety and had Klonopin twice a day for her anxiety.  On day of discharge, the patient's sleep was good.  Her appetite was good.  She was having mild depressive symptoms, rating it a 3 on a scale of 1 to 10, adamantly denying any suicidal or homicidal thoughts.  Anxiety under fair to good control.  No auditory hallucinations or delusional thinking.  No medication side effects.  The patient was stable for discharge.  DISCHARGE MEDICATIONS: 1. Lexapro 10 mg daily. 2. Klonopin 0.5 mg one b.i.d.  FOLLOW-UP APPOINTMENTS:  With [name] at the [facility]  on June 5, and [name] on Monday, July 8, at 3 Phamm. at phone number [phone].     Landry Corporal, N.P.   ______________________________ Patricia Gallo, MD    JO/MEDQ  D:  03/31/2011  T:  03/31/2011  Job:  [id number]  Electronically Signed by Patricia PatriciaP. on 03/31/2011 05:12:01 PM Electronically Signed by Patricia Gallo MD on 03/31/2011 05:19:01 PM

## 2011-07-19 LAB — RPR: RPR Ser Ql: NONREACTIVE

## 2011-07-19 LAB — CBC
HCT: 33.3 — ABNORMAL LOW
HCT: 38.5
Hemoglobin: 11.5 — ABNORMAL LOW
Hemoglobin: 13.2
MCHC: 34.3
MCHC: 34.6
MCV: 94.7
MCV: 95.1
Platelets: 173
Platelets: 194
RBC: 3.52 — ABNORMAL LOW
RBC: 4.05
RDW: 13.5
RDW: 13.9
WBC: 13 — ABNORMAL HIGH
WBC: 9.7

## 2011-07-26 LAB — URINALYSIS, ROUTINE W REFLEX MICROSCOPIC
Bilirubin Urine: NEGATIVE
Glucose, UA: NEGATIVE
Hgb urine dipstick: NEGATIVE
Ketones, ur: NEGATIVE
Nitrite: NEGATIVE
Protein, ur: NEGATIVE
Specific Gravity, Urine: 1.02
Urobilinogen, UA: 1
pH: 6.5

## 2011-08-05 LAB — COMPREHENSIVE METABOLIC PANEL
ALT: 14
ALT: 16
AST: 20
AST: 30
Albumin: 2.8 — ABNORMAL LOW
Albumin: 3.5
Alkaline Phosphatase: 35 — ABNORMAL LOW
Alkaline Phosphatase: 43
BUN: 4 — ABNORMAL LOW
BUN: 5 — ABNORMAL LOW
CO2: 18 — ABNORMAL LOW
CO2: 23
Calcium: 8.2 — ABNORMAL LOW
Calcium: 8.8
Chloride: 106
Chloride: 107
Creatinine, Ser: 0.52
Creatinine, Ser: 0.53
GFR calc Af Amer: >60
GFR calc Af Amer: >60
GFR calc non Af Amer: >60
GFR calc non Af Amer: >60
Glucose, Bld: 106 — ABNORMAL HIGH
Glucose, Bld: 80
Potassium: 3 — ABNORMAL LOW
Potassium: 3.7
Sodium: 135
Sodium: 135
Total Bilirubin: 0.6
Total Bilirubin: 1.1
Total Protein: 5.1 — ABNORMAL LOW
Total Protein: 6

## 2011-08-05 LAB — URINALYSIS, DIPSTICK ONLY
Bilirubin Urine: NEGATIVE
Bilirubin Urine: NEGATIVE
Glucose, UA: NEGATIVE
Glucose, UA: NEGATIVE
Hgb urine dipstick: NEGATIVE
Hgb urine dipstick: NEGATIVE
Ketones, ur: >80 — AB
Ketones, ur: >80 — AB
Leukocytes, UA: NEGATIVE
Leukocytes, UA: NEGATIVE
Nitrite: NEGATIVE
Nitrite: NEGATIVE
Protein, ur: NEGATIVE
Protein, ur: NEGATIVE
Specific Gravity, Urine: 1.02
Specific Gravity, Urine: 1.025
Urobilinogen, UA: 0.2
Urobilinogen, UA: 0.2
pH: 6
pH: 6.5

## 2011-08-05 LAB — AMYLASE: Amylase: 60

## 2011-08-05 LAB — LIPASE, BLOOD: Lipase: 32

## 2011-08-05 LAB — TSH: TSH: 0.898

## 2011-08-05 LAB — H. PYLORI ANTIBODY, IGG: H Pylori IgG: <0.4

## 2011-08-09 LAB — CBC
HCT: 35 — ABNORMAL LOW
HCT: 41
HCT: 44.2
Hemoglobin: 11.9 — ABNORMAL LOW
Hemoglobin: 13.9
Hemoglobin: 14.6
MCHC: 33.9
MCHC: 34.1
MCHC: 33
MCV: 92.4
MCV: 94.3
MCV: 93.8
Platelets: 196
Platelets: 208
Platelets: 280
RBC: 3.72 — ABNORMAL LOW
RBC: 4.43
RBC: 4.71
RDW: 12.1
RDW: 12.4
RDW: 12.4
WBC: 14 — ABNORMAL HIGH
WBC: 17 — ABNORMAL HIGH
WBC: 17.3 — ABNORMAL HIGH

## 2011-08-09 LAB — URINALYSIS, DIPSTICK ONLY
Bilirubin Urine: NEGATIVE
Glucose, UA: NEGATIVE
Hgb urine dipstick: NEGATIVE
Ketones, ur: >80 — AB
Leukocytes, UA: NEGATIVE
Nitrite: NEGATIVE
Protein, ur: NEGATIVE
Specific Gravity, Urine: >1.03 — ABNORMAL HIGH
Urobilinogen, UA: 0.2
pH: 6

## 2011-08-09 LAB — DIFFERENTIAL
Basophils Absolute: 0
Basophils Absolute: 0.1
Basophils Relative: 0
Basophils Relative: 1
Eosinophils Absolute: 0
Eosinophils Absolute: 0.1
Eosinophils Relative: 0
Eosinophils Relative: 1
Lymphocytes Relative: 7 — ABNORMAL LOW
Lymphocytes Relative: 10 — ABNORMAL LOW
Lymphs Abs: 1
Lymphs Abs: 1.6
Monocytes Absolute: 0.6
Monocytes Absolute: 0.6
Monocytes Relative: 5
Monocytes Relative: 3
Neutro Abs: 12.3 — ABNORMAL HIGH
Neutro Abs: 14.9 — ABNORMAL HIGH
Neutrophils Relative %: 88 — ABNORMAL HIGH
Neutrophils Relative %: 86 — ABNORMAL HIGH

## 2011-08-09 LAB — COMPREHENSIVE METABOLIC PANEL
ALT: 16
ALT: 22
AST: 18
AST: 23
Albumin: 3.2 — ABNORMAL LOW
Albumin: 4.3
Alkaline Phosphatase: 36 — ABNORMAL LOW
Alkaline Phosphatase: 46
BUN: 3 — ABNORMAL LOW
BUN: 5 — ABNORMAL LOW
CO2: 20
CO2: 19
Calcium: 8 — ABNORMAL LOW
Calcium: 9.3
Chloride: 112
Chloride: 107
Creatinine, Ser: 0.56
Creatinine, Ser: 0.74
GFR calc Af Amer: >60
GFR calc Af Amer: >60
GFR calc non Af Amer: >60
GFR calc non Af Amer: >60
Glucose, Bld: 173 — ABNORMAL HIGH
Glucose, Bld: 120 — ABNORMAL HIGH
Potassium: 3.1 — ABNORMAL LOW
Potassium: 3.1 — ABNORMAL LOW
Sodium: 135
Sodium: 137
Total Bilirubin: 0.9
Total Bilirubin: 1
Total Protein: 5.4 — ABNORMAL LOW
Total Protein: 7

## 2011-08-09 LAB — URINE MICROSCOPIC-ADD ON

## 2011-08-09 LAB — URINALYSIS, ROUTINE W REFLEX MICROSCOPIC
Bilirubin Urine: NEGATIVE
Glucose, UA: NEGATIVE
Hgb urine dipstick: NEGATIVE
Ketones, ur: >80 — AB
Leukocytes, UA: NEGATIVE
Nitrite: NEGATIVE
Protein, ur: 30 — AB
Specific Gravity, Urine: 1.028
Urobilinogen, UA: 0.2
pH: 6

## 2011-08-09 LAB — STOOL CULTURE

## 2011-08-09 LAB — HCG, QUANTITATIVE, PREGNANCY: hCG, Beta Chain, Quant, S: 18506 — ABNORMAL HIGH

## 2011-08-09 LAB — FECAL LACTOFERRIN, QUANT: Fecal Lactoferrin: NEGATIVE

## 2011-08-09 LAB — I-STAT 8, (EC8 V) (CONVERTED LAB)
Acid-base deficit: 6 — ABNORMAL HIGH
BUN: 4 — ABNORMAL LOW
Bicarbonate: 17.1 — ABNORMAL LOW
Chloride: 107
Glucose, Bld: 130 — ABNORMAL HIGH
HCT: 48 — ABNORMAL HIGH
Hemoglobin: 16.3 — ABNORMAL HIGH
Operator id: 235261
Potassium: 3.1 — ABNORMAL LOW
Sodium: 140
TCO2: 18
pCO2, Ven: 26.6 — ABNORMAL LOW
pH, Ven: 7.415 — ABNORMAL HIGH

## 2011-08-09 LAB — POCT I-STAT CREATININE
Creatinine, Ser: 0.7
Operator id: 235261

## 2011-08-09 LAB — TSH: TSH: 0.403

## 2011-08-09 LAB — CLOSTRIDIUM DIFFICILE EIA: C difficile Toxins A+B, EIA: NEGATIVE

## 2011-08-09 LAB — LIPASE, BLOOD: Lipase: 18

## 2011-10-20 DIAGNOSIS — J189 Pneumonia, unspecified organism: Secondary | ICD-10-CM

## 2011-10-20 HISTORY — DX: Pneumonia, unspecified organism: J18.9

## 2011-10-22 ENCOUNTER — Inpatient Hospital Stay (HOSPITAL_COMMUNITY): Payer: 59

## 2011-10-22 ENCOUNTER — Encounter (HOSPITAL_COMMUNITY): Payer: Self-pay | Admitting: *Deleted

## 2011-10-22 ENCOUNTER — Inpatient Hospital Stay (HOSPITAL_COMMUNITY)
Admission: AD | Admit: 2011-10-22 | Discharge: 2011-10-23 | Disposition: A | Discharge status: Home / Self Care | Payer: 59 | Source: Ambulatory Visit | Attending: Obstetrics & Gynecology | Admitting: Obstetrics & Gynecology

## 2011-10-22 DIAGNOSIS — N83202 Unspecified ovarian cyst, left side: Secondary | ICD-10-CM

## 2011-10-22 DIAGNOSIS — N920 Excessive and frequent menstruation with regular cycle: Secondary | ICD-10-CM

## 2011-10-22 DIAGNOSIS — N83209 Unspecified ovarian cyst, unspecified side: Secondary | ICD-10-CM | POA: Insufficient documentation

## 2011-10-22 DIAGNOSIS — R1032 Left lower quadrant pain: Secondary | ICD-10-CM

## 2011-10-22 DIAGNOSIS — N949 Unspecified condition associated with female genital organs and menstrual cycle: Secondary | ICD-10-CM | POA: Insufficient documentation

## 2011-10-22 DIAGNOSIS — N938 Other specified abnormal uterine and vaginal bleeding: Secondary | ICD-10-CM | POA: Insufficient documentation

## 2011-10-22 DIAGNOSIS — N760 Acute vaginitis: Secondary | ICD-10-CM | POA: Insufficient documentation

## 2011-10-22 DIAGNOSIS — A499 Bacterial infection, unspecified: Secondary | ICD-10-CM | POA: Insufficient documentation

## 2011-10-22 DIAGNOSIS — B9689 Other specified bacterial agents as the cause of diseases classified elsewhere: Secondary | ICD-10-CM | POA: Insufficient documentation

## 2011-10-22 HISTORY — DX: Pneumonia, unspecified organism: J18.9

## 2011-10-22 LAB — URINALYSIS, ROUTINE W REFLEX MICROSCOPIC
Bilirubin Urine: NEGATIVE
Glucose, UA: NEGATIVE mg/dL
Ketones, ur: 15 mg/dL — AB
Leukocytes, UA: NEGATIVE
Nitrite: NEGATIVE
Protein, ur: NEGATIVE mg/dL
Specific Gravity, Urine: >1.03 — ABNORMAL HIGH (ref 1.005–1.030)
Urobilinogen, UA: 0.2 mg/dL (ref 0.0–1.0)
pH: 6 (ref 5.0–8.0)

## 2011-10-22 LAB — CBC
HCT: 39.7 % (ref 36.0–46.0)
Hemoglobin: 13.6 g/dL (ref 12.0–15.0)
MCH: 31.5 pg (ref 26.0–34.0)
MCHC: 34.3 g/dL (ref 30.0–36.0)
MCV: 91.9 fL (ref 78.0–100.0)
Platelets: 215 10*3/uL (ref 150–400)
RBC: 4.32 MIL/uL (ref 3.87–5.11)
RDW: 12.8 % (ref 11.5–15.5)
WBC: 6.6 10*3/uL (ref 4.0–10.5)

## 2011-10-22 LAB — WET PREP, GENITAL
Trich, Wet Prep: NONE SEEN
WBC, Wet Prep HPF POC: NONE SEEN
Yeast Wet Prep HPF POC: NONE SEEN

## 2011-10-22 LAB — URINE MICROSCOPIC-ADD ON

## 2011-10-22 LAB — POCT PREGNANCY, URINE: Preg Test, Ur: NEGATIVE

## 2011-10-22 NOTE — ED Notes (Signed)
[name] CNM in to see pt 

## 2011-10-22 NOTE — Progress Notes (Signed)
Ivonne Andrew CNM in to see pt. Spec exam done and wet prep and GC/Chlam obtained. Decided to do GC/Chlam after initially thinking would only do wet prep

## 2011-10-22 NOTE — ED Provider Notes (Signed)
History   [name] is a 30 y.o. year old G76P2022 female who presents to [facility] reporting 2 periods this month, soaking a tampon every two-three hours, mild weakness, vaginal irritation and mild LLQ pain. She states she usually has regular 28-day cycles, but had a cycle 7 days early on 10/04/11 and another cycle starting 10/20/11. She was Dx w/ pneumonia 3 days ago and has been taking a Z-pack w/ good results. She has been afebrile since then.    CSN: [id number]  Arrival date & time 10/22/11  2123   None     Chief Complaint  Patient presents with  . Vaginal Bleeding    (Consider location/radiation/quality/duration/timing/severity/associated sxs/prior treatment) HPI  Past Medical History  Diagnosis Date  . Pneumonia 10/20/2011    ON z-pak for 3 days now    Past Surgical History  Procedure Date  . Foot surgery March 2011 and April 2011    Bilateral foot surgery. One in March and one in April    Family History  Problem Relation Age of Onset  . Anesthesia problems Mother   . Hypotension Neg Hx   . Malignant hyperthermia Neg Hx   . Pseudochol deficiency Neg Hx     History  Substance Use Topics  . Smoking status: Current Some Day Smoker  . Smokeless tobacco: Not on file  . Alcohol Use: No    OB History    Grav Para Term Preterm Abortions TAB SAB Ect Mult Living   4 2 2  0 2 0 2 0 0 2      Review of Systems: Neg for fever, chills, N/V, diarrhea, constipation, cough  Allergies  Review of patient's allergies indicates no known allergies.  Home Medications  No current outpatient prescriptions on file.  BP 132/58  Pulse 93  Temp(Src) 98.4 F (36.9 C) (Oral)  Resp 20  Ht 5\' 5"  (1.651 m)  Wt 65.318 kg (144 lb)  BMI 23.96 kg/m2  LMP 10/04/2011  Physical Exam NAD, A&OX4 Abd: soft, NT, no masses Pelvic: BUS normal, vagina no lesions or discharge. Cervix non-friable, no discharge. + nabothian cysts. No CMT or adnexal masses or tenderness.  Results for orders  placed during the hospital encounter of 10/22/11 (from the past 24 hour(s))  URINALYSIS, ROUTINE W REFLEX MICROSCOPIC     Status: Abnormal   Collection Time   10/22/11  9:45 PM      Component Value Range   Color, Urine YELLOW  YELLOW    APPearance CLEAR  CLEAR    Specific Gravity, Urine >1.030 (*) 1.005 - 1.030    pH 6.0  5.0 - 8.0    Glucose, UA NEGATIVE  NEGATIVE (mg/dL)   Hgb urine dipstick SMALL (*) NEGATIVE    Bilirubin Urine NEGATIVE  NEGATIVE    Ketones, ur 15 (*) NEGATIVE (mg/dL)   Protein, ur NEGATIVE  NEGATIVE (mg/dL)   Urobilinogen, UA 0.2  0.0 - 1.0 (mg/dL)   Nitrite NEGATIVE  NEGATIVE    Leukocytes, UA NEGATIVE  NEGATIVE   URINE MICROSCOPIC-ADD ON     Status: Normal   Collection Time   10/22/11  9:45 PM      Component Value Range   Squamous Epithelial / LPF RARE  RARE    RBC / HPF 3-6  <3 (RBC/hpf)   Bacteria, UA RARE  RARE   POCT PREGNANCY, URINE     Status: Normal   Collection Time   10/22/11  9:50 PM      Component Value  Range   Preg Test, Ur NEGATIVE    CBC     Status: Normal   Collection Time   10/22/11 10:28 PM      Component Value Range   WBC 6.6  4.0 - 10.5 (K/uL)   RBC 4.32  3.87 - 5.11 (MIL/uL)   Hemoglobin 13.6  12.0 - 15.0 (g/dL)   HCT 52.8  41.3 - 24.4 (%)   MCV 91.9  78.0 - 100.0 (fL)   MCH 31.5  26.0 - 34.0 (pg)   MCHC 34.3  30.0 - 36.0 (g/dL)   RDW 01.0  27.2 - 53.6 (%)   Platelets 215  150 - 400 (K/uL)  WET PREP, GENITAL     Status: Abnormal   Collection Time   10/22/11 11:10 PM      Component Value Range   Yeast, Wet Prep NONE SEEN  NONE SEEN    Trich, Wet Prep NONE SEEN  NONE SEEN    Clue Cells, Wet Prep FEW (*) NONE SEEN    WBC, Wet Prep HPF POC NONE SEEN  NONE SEEN    US Transvaginal Non-ob  10/22/2011  *RADIOLOGY REPORT*  Clinical Data: New onset menorrhagia.  Left lower quadrant abdominal pain left pelvic pain.  History of ovarian cysts.  TRANSABDOMINAL AND TRANSVAGINAL ULTRASOUND OF PELVIS 10/22/2011:  Technique:  Both  transabdominal and transvaginal ultrasound examinations of the pelvis were performed. Transabdominal technique was performed for global imaging of the pelvis including uterus, ovaries, adnexal regions, and pelvic cul-de-sac.  Comparison: None.   It was necessary to proceed with endovaginal exam following the transabdominal exam to visualize the uterus and ovaries, as the bladder was incompletely distended.  Findings:  Uterus: Normal in size measuring approximate 6.8 x 3.7 x 4.5 cm. No focal myometrial abnormality.  Normal appearing uterine cervix.  Endometrium: Normal in appearance measuring approximately 2 mm in thickness.  No endometrial fluid or mass.  Right ovary:  Normal in size measuring approximately 3.2 x 1.2 x 1.6 cm, containing small follicular cysts.  No dominant cyst or solid mass.  Left ovary: Normal in size measuring approximately 4.8 x 2.3 x 3.3 cm, containing follicular cysts and a dominant approximate 2.9 cm cyst.  No solid mass.  Normal color Doppler signal in the ovary.  Other findings: No free pelvic fluid.  IMPRESSION:  1.  Approximate 3 cm simple cyst left ovary. 2.  Otherwise normal pelvic ultrasound.  This is almost certainly benign, and no specific imaging follow up is recommended according to the Society of Radiologists in Ultrasound 2010 Consensus  Conference Statement (D Lenis Noon et al. Management of Asymptomatic Ovarian and Other Adnexal Cysts Imaged at [place]:  Society of Radiologists in Ultrasound Consensus Conference Statement 2010.  Radiology 256 (Sept 2010): 943-954.).  Original Report Authenticated By: Arnell Sieving, M.D.   US Pelvis Complete  10/22/2011  *RADIOLOGY REPORT*  Clinical Data: New onset menorrhagia.  Left lower quadrant abdominal pain left pelvic pain.  History of ovarian cysts.  TRANSABDOMINAL AND TRANSVAGINAL ULTRASOUND OF PELVIS 10/22/2011:  Technique:  Both transabdominal and transvaginal ultrasound examinations of the pelvis were performed. Transabdominal  technique was performed for global imaging of the pelvis including uterus, ovaries, adnexal regions, and pelvic cul-de-sac.  Comparison: None.   It was necessary to proceed with endovaginal exam following the transabdominal exam to visualize the uterus and ovaries, as the bladder was incompletely distended.  Findings:  Uterus: Normal in size measuring approximate 6.8 x 3.7 x 4.5 cm. No focal  myometrial abnormality.  Normal appearing uterine cervix.  Endometrium: Normal in appearance measuring approximately 2 mm in thickness.  No endometrial fluid or mass.  Right ovary:  Normal in size measuring approximately 3.2 x 1.2 x 1.6 cm, containing small follicular cysts.  No dominant cyst or solid mass.  Left ovary: Normal in size measuring approximately 4.8 x 2.3 x 3.3 cm, containing follicular cysts and a dominant approximate 2.9 cm cyst.  No solid mass.  Normal color Doppler signal in the ovary.  Other findings: No free pelvic fluid.  IMPRESSION:  1.  Approximate 3 cm simple cyst left ovary. 2.  Otherwise normal pelvic ultrasound.  This is almost certainly benign, and no specific imaging follow up is recommended according to the Society of Radiologists in Ultrasound 2010 Consensus  Conference Statement (D Lenis Noon et al. Management of Asymptomatic Ovarian and Other Adnexal Cysts Imaged at [place]:  Society of Radiologists in Ultrasound Consensus Conference Statement 2010.  Radiology 256 (Sept 2010): [phone].).  Original Report Authenticated By: Arnell Sieving, M.D.    ED Course  Procedures (including critical care time)   MDM  Assessment: 1. DUB, hemodynamically stable 2. BV 3. Left simple ovarian cyst  Plan: 1. D/C home 2. Rx Flagyl and Toradol 3. F/U w/ Gynecologist or [facility]U for worsening Sx.  Katrinka Blazing, Jaramiah Bossard 10/23/2011 3:02 AM

## 2011-10-22 NOTE — Progress Notes (Signed)
LMP 12/10. Stopped 12/16 and then started bleeding 12/26 and continues. Has pretty regular 28day cycle. Changing tampons about every 2 -3hrs. G4P2 SABx 2. Dx with pneumonia Weds and on Z-pak

## 2011-10-23 MED ORDER — METRONIDAZOLE 500 MG PO TABS: 500.0000 mg | ORAL_TABLET | Freq: Two times a day (BID) | ORAL | Status: AC

## 2011-10-23 MED ORDER — KETOROLAC TROMETHAMINE 10 MG PO TABS: 10.0000 mg | ORAL_TABLET | Freq: Four times a day (QID) | ORAL | Status: AC | PRN

## 2011-10-23 NOTE — ED Notes (Signed)
[name] cNM in to discuss u/s results and d/c plan with pt

## 2011-10-23 NOTE — Progress Notes (Signed)
Ivonne Andrew CNM in. Written and verbal d/c instructions given and understanding voiced

## 2011-10-25 LAB — GC/CHLAMYDIA PROBE AMP, GENITAL
Chlamydia, DNA Probe: NEGATIVE
GC Probe Amp, Genital: NEGATIVE

## 2012-01-06 IMAGING — US US TRANSVAGINAL NON-OB
1 series · 13 of 25 positions shown · non-contrast
Comparison: None.

CLINICAL DATA: New onset menorrhagia.  Left lower quadrant
abdominal pain left pelvic pain.  History of ovarian cysts.

TRANSABDOMINAL AND TRANSVAGINAL ULTRASOUND OF PELVIS 10/22/2011:
TECHNIQUE: Both transabdominal and transvaginal ultrasound
examinations of the pelvis were performed. Transabdominal technique
was performed for global imaging of the pelvis including uterus,
ovaries, adnexal regions, and pelvic cul-de-sac.

[Series 1: us transvaginal non-ob · 13 of 45 slices shown]
[im 1/45]
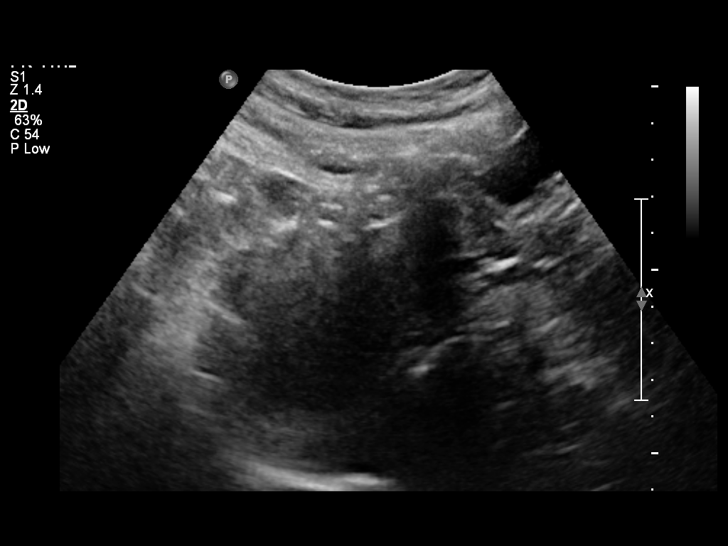
[im 4/45]
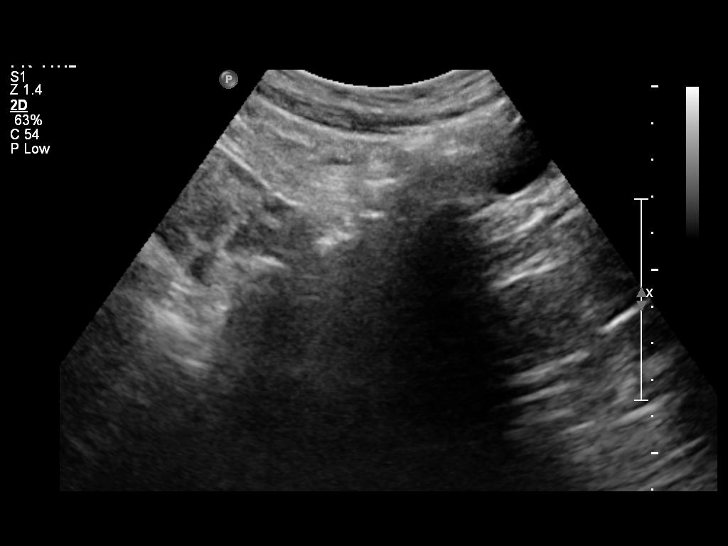
[im 8/45]
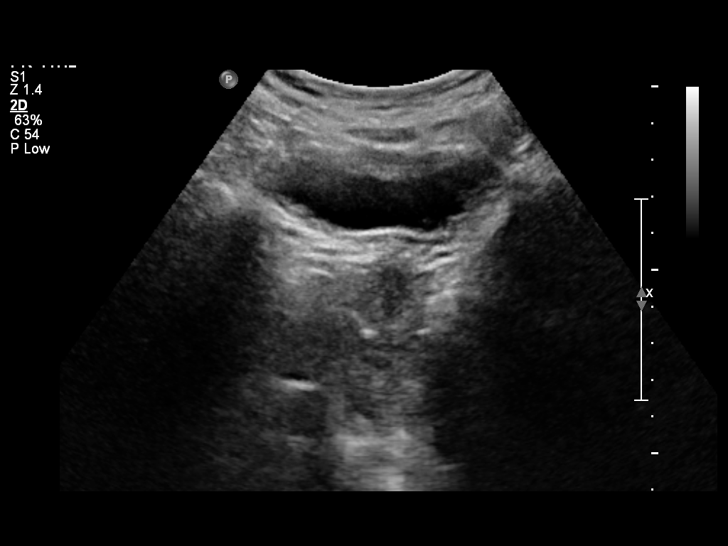
[im 12/45]
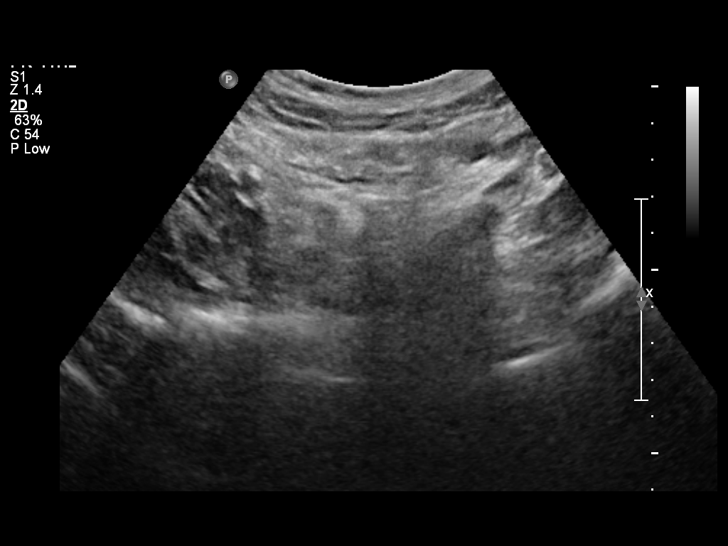
[im 15/45]
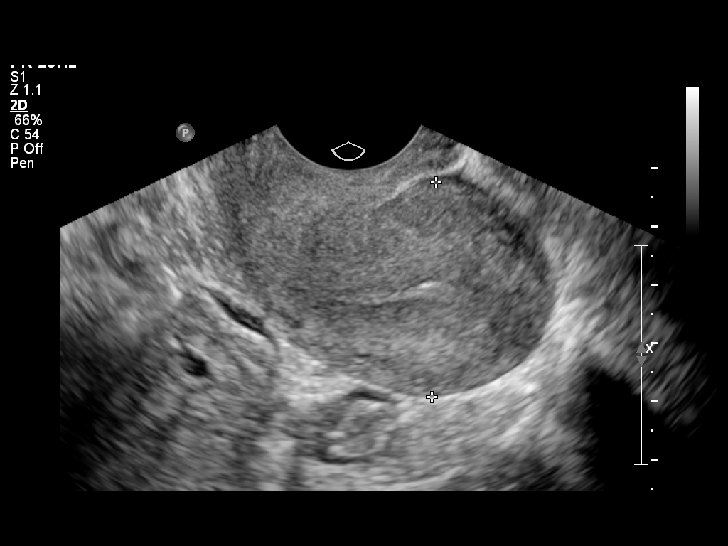
[im 19/45]
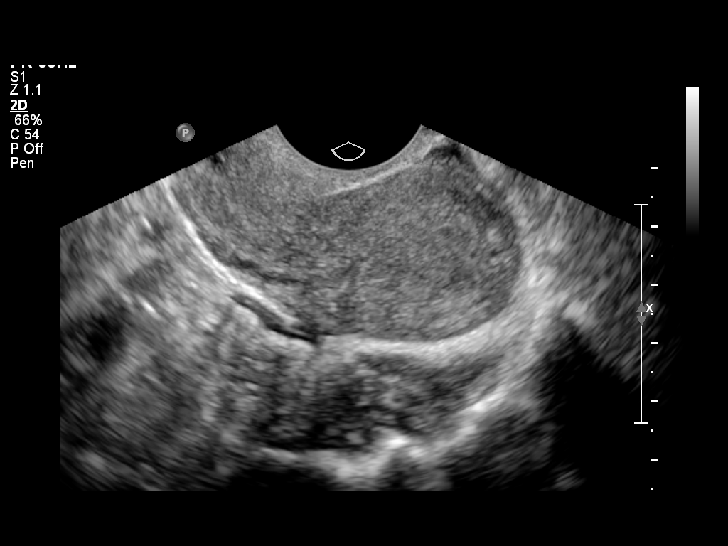
[im 23/45]
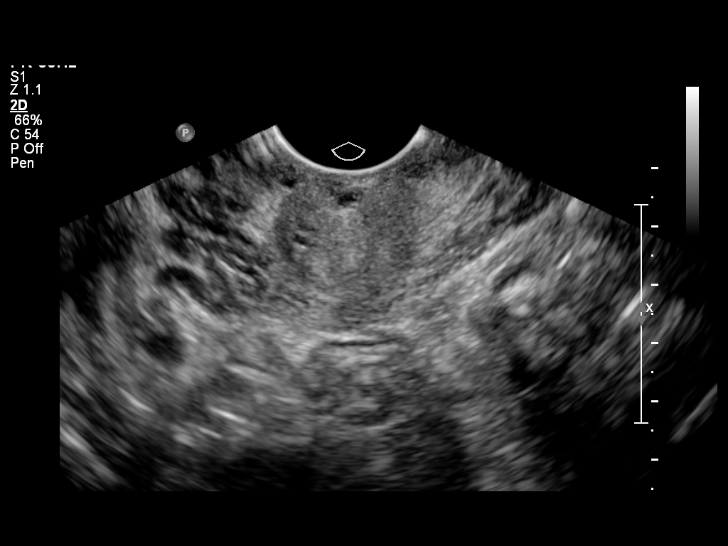
[im 26/45]
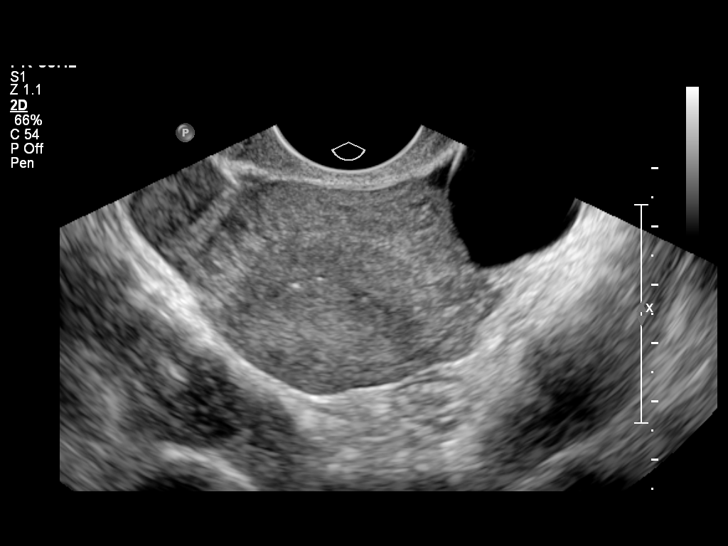
[im 30/45]
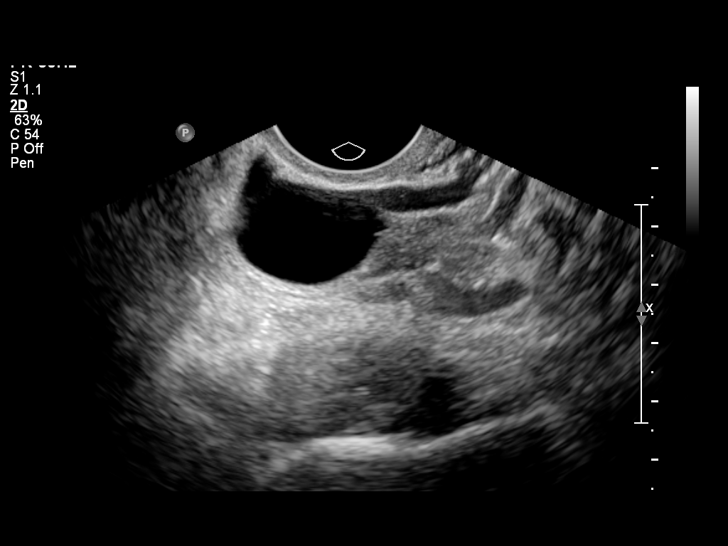
[im 34/45]
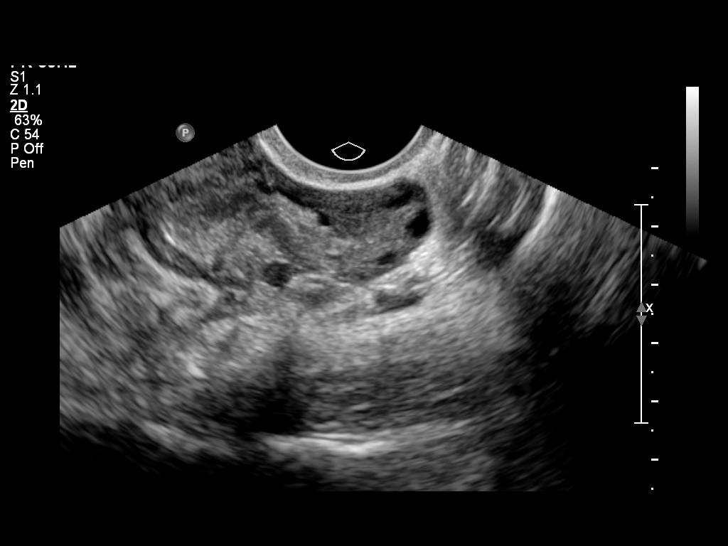
[im 37/45]
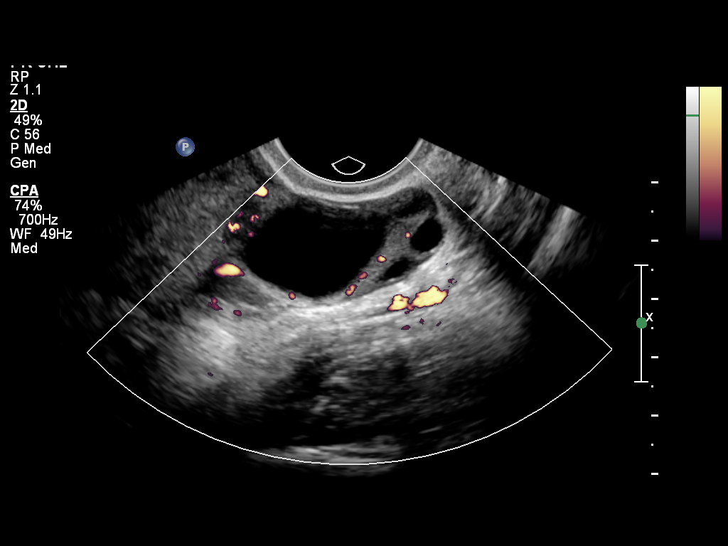
[im 41/45]
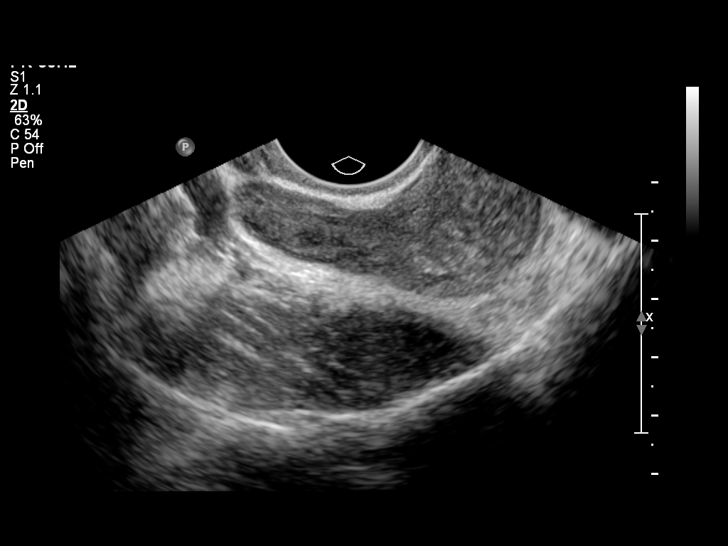
[im 45/45]
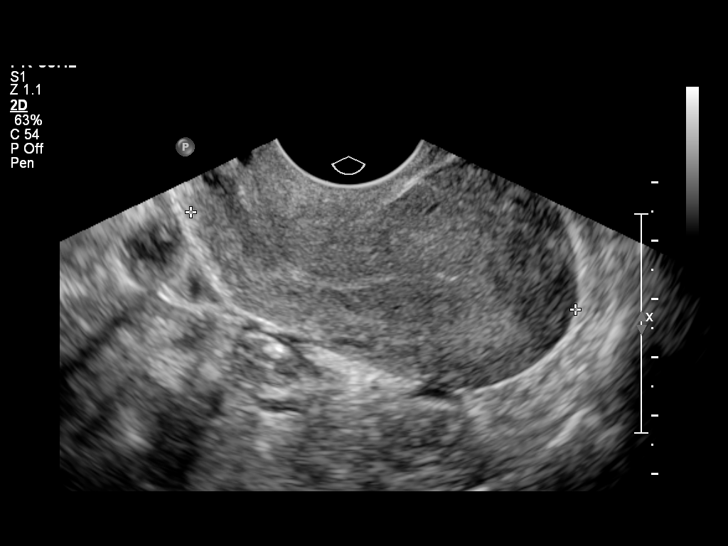

[13 of 25 positions shown; findings below may reference images not displayed]

It was necessary to proceed with endovaginal exam following the
transabdominal exam to visualize the uterus and ovaries, as the
bladder was incompletely distended.
FINDINGS: Uterus: Normal in size measuring approximate 6.8 x 3.7 x 4.5 cm.
No focal myometrial abnormality.  Normal appearing uterine cervix.

Endometrium: Normal in appearance measuring approximately 2 mm in
thickness.  No endometrial fluid or mass.

Right ovary:  Normal in size measuring approximately 3.2 x 1.2 x
1.6 cm, containing small follicular cysts.  No dominant cyst or
solid mass.

Left ovary: Normal in size measuring approximately 4.8 x 2.3 x
cm, containing follicular cysts and a dominant approximate 2.9 cm
cyst.  No solid mass.  Normal color Doppler signal in the ovary.

Other findings: No free pelvic fluid.
IMPRESSION: 1.  Approximate 3 cm simple cyst left ovary.
2.  Otherwise normal pelvic ultrasound.

This is almost certainly benign, and no specific imaging follow up
is recommended according to the Society of Radiologists in
Ultrasound 6484 Consensus  Conference Statement (Pee Jay Festejo et al.
Management of Asymptomatic Ovarian and Other Adnexal Cysts Imaged
at [place]:  Society of Radiologists in Ultrasound Consensus Conference
Statement 6484.  Radiology [DATE]): [phone].).

## 2013-08-27 ENCOUNTER — Ambulatory Visit: Payer: 59

## 2014-08-26 ENCOUNTER — Encounter (HOSPITAL_COMMUNITY): Payer: Self-pay | Admitting: *Deleted

## 2016-03-12 ENCOUNTER — Emergency Department (HOSPITAL_COMMUNITY)
Admission: EM | Admit: 2016-03-12 | Discharge: 2016-03-12 | Disposition: A | Discharge status: Home / Self Care | Payer: 59 | Attending: Emergency Medicine | Admitting: Emergency Medicine

## 2016-03-12 ENCOUNTER — Emergency Department (HOSPITAL_COMMUNITY): Payer: 59

## 2016-03-12 ENCOUNTER — Encounter (HOSPITAL_COMMUNITY): Payer: Self-pay

## 2016-03-12 DIAGNOSIS — R079 Chest pain, unspecified: Secondary | ICD-10-CM | POA: Diagnosis present

## 2016-03-12 DIAGNOSIS — F17200 Nicotine dependence, unspecified, uncomplicated: Secondary | ICD-10-CM | POA: Diagnosis not present

## 2016-03-12 DIAGNOSIS — R0789 Other chest pain: Secondary | ICD-10-CM | POA: Diagnosis not present

## 2016-03-12 LAB — BASIC METABOLIC PANEL
Anion gap: 8 (ref 5–15)
BUN: 12 mg/dL (ref 6–20)
CO2: 22 mmol/L (ref 22–32)
Calcium: 9 mg/dL (ref 8.9–10.3)
Chloride: 107 mmol/L (ref 101–111)
Creatinine, Ser: 0.77 mg/dL (ref 0.44–1.00)
GFR calc Af Amer: >60 mL/min (ref >60)
GFR calc non Af Amer: >60 mL/min (ref >60)
Glucose, Bld: 83 mg/dL (ref 65–99)
Potassium: 3.7 mmol/L (ref 3.5–5.1)
Sodium: 137 mmol/L (ref 135–145)

## 2016-03-12 LAB — CBC
HCT: 39.5 % (ref 36.0–46.0)
Hemoglobin: 13.3 g/dL (ref 12.0–15.0)
MCH: 31.1 pg (ref 26.0–34.0)
MCHC: 33.7 g/dL (ref 30.0–36.0)
MCV: 92.5 fL (ref 78.0–100.0)
Platelets: 257 10*3/uL (ref 150–400)
RBC: 4.27 MIL/uL (ref 3.87–5.11)
RDW: 13.7 % (ref 11.5–15.5)
WBC: 8.5 10*3/uL (ref 4.0–10.5)

## 2016-03-12 LAB — I-STAT TROPONIN, ED
Troponin i, poc: 0 ng/mL (ref 0.00–0.08)
Troponin i, poc: 0 ng/mL (ref 0.00–0.08)

## 2016-03-12 MED ORDER — CYCLOBENZAPRINE HCL 5 MG PO TABS: 5.0000 mg | ORAL_TABLET | Freq: Three times a day (TID) | ORAL | Status: DC | PRN

## 2016-03-12 MED ORDER — IBUPROFEN 200 MG PO TABS
600.0000 mg | ORAL_TABLET | Freq: Once | ORAL | Status: AC
  Administered 2016-03-12: 600 mg via ORAL
  Filled 2016-03-12: qty 3

## 2016-03-12 MED ORDER — CYCLOBENZAPRINE HCL 10 MG PO TABS
5.0000 mg | ORAL_TABLET | Freq: Once | ORAL | Status: AC
  Administered 2016-03-12: 5 mg via ORAL
  Filled 2016-03-12: qty 1

## 2016-03-12 NOTE — ED Notes (Addendum)
Pt states on conference call.  Started having center chest pain.  Started walking around and felt light headed.  Finished work day.  Feels pain when she talks.  Left arm started aching.  Pain in left neck.  No recent cough/congestion.  No increased activity.  Pt has hx of reflux.  Denies eating prior to event.  Pain increases with inhalation.  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  Some shortness of breath.  No hx of blood clots.  Denies birth control tabs

## 2016-03-12 NOTE — Discharge Instructions (Signed)
Take motrin for pain.   Take flexeril for muscle spasms.   No heavy lifting.   See your doctor.   Return to ER if you have worse chest pain, shortness of breath.

## 2016-03-12 NOTE — ED Provider Notes (Signed)
CSN: [id number]     Arrival date & time 03/12/16  1749 History   First MD Initiated Contact with Patient 03/12/16 2158     Chief Complaint  Patient presents with  . Chest Pain     (Consider location/radiation/quality/duration/timing/severity/associated sxs/prior Treatment) The history is provided by the patient.  [name] is a 35 y.o. female who presenting with chest pain. She was on a conference call around 3 PM and had a sided chest pain that persisted for several hours. She initially thought was reflux and then radiated to the left neck and the arm so she was concerned. The pain is worse with movement and worse with deep breath. Patient denies any leg pain or swelling or recent travels. No history of cardiac disease or family history of MI at young age.      Past Medical History  Diagnosis Date  . Pneumonia 10/20/2011    ON z-pak for 3 days now   Past Surgical History  Procedure Laterality Date  . Foot surgery  March 2011 and April 2011    Bilateral foot surgery. One in March and one in April   Family History  Problem Relation Age of Onset  . Anesthesia problems Mother   . Hypotension Neg Hx   . Malignant hyperthermia Neg Hx   . Pseudochol deficiency Neg Hx    Social History  Substance Use Topics  . Smoking status: Current Some Day Smoker  . Smokeless tobacco: None  . Alcohol Use: No   OB History    Gravida Para Term Preterm AB TAB SAB Ectopic Multiple Living   0 2 0 2 0 0 2     Review of Systems  Cardiovascular: Positive for chest pain.  All other systems reviewed and are negative.     Allergies  Review of patient's allergies indicates no known allergies.  Home Medications   Prior to Admission medications   Not on File   BP 115/87 mmHg  Pulse 57  Temp(Src) 98.3 F (36.8 C) (Oral)  Resp 16  SpO2 100%  LMP 03/12/2016 Physical Exam  Constitutional: She appears well-developed and well-nourished.  HENT:  Head: Normocephalic.  Mouth/Throat:  Oropharynx is clear and moist.  Eyes: Conjunctivae are normal. Pupils are equal, round, and reactive to light.  Neck: Normal range of motion.  Cardiovascular: Normal rate, regular rhythm and normal heart sounds.   Pulmonary/Chest: Effort normal and breath sounds normal.  Reproducible left sided chest tenderness, lungs clear   Abdominal: Soft. Bowel sounds are normal. She exhibits no distension. There is no tenderness. There is no rebound.  Musculoskeletal: Normal range of motion. She exhibits no edema or tenderness.  Neurological: She is alert. No cranial nerve deficit. Coordination normal.  Skin: Skin is warm and dry.  Psychiatric: She has a normal mood and affect. Her behavior is normal. Judgment normal.  Nursing note and vitals reviewed.   ED Course  Procedures (including critical care time) Labs Review Labs Reviewed  BASIC METABOLIC PANEL  CBC  I-STAT TROPOININ, ED  Rosezena Sensor, ED    Imaging Review Dg Chest 2 View  03/12/2016  CLINICAL DATA:  Chest pain EXAM: CHEST  2 VIEW COMPARISON:  None. FINDINGS: Normal heart size. Normal mediastinal contour. No pneumothorax. No pleural effusion. Lungs appear clear, with no acute consolidative airspace disease and no pulmonary edema. IMPRESSION: No active cardiopulmonary disease. Electronically Signed   By: Delbert Phenix M.D.   On: 03/12/2016 19:23   I have  personally reviewed and evaluated these images and lab results as part of my medical decision-making.   EKG Interpretation   Date/Time:  Friday Mar 12 2016 18:00:23 EDT Ventricular Rate:  67 PR Interval:  162 QRS Duration: 78 QT Interval:  384 QTC Calculation: 405 R Axis:   54 Text Interpretation:  Sinus rhythm Low voltage, precordial leads Baseline  wander in lead(s) V6 No significant change since last tracing Confirmed by  Julene Rahn  MD, Gio Janoski ([id number]) on 03/12/2016 10:01:28 PM      MDM   Final diagnoses:  None   [name] is a 35 y.o. female here with chest pain. Pain  is reproducible, likely MSK. PERC neg. Delta trop neg, labs and CXR unremarkable. Felt better with flexeril, motrin, will dc home with same. Vitals stable.     Richardean Canalavid H Znya Albino, MD 03/12/16 [phone]

## 2018-12-05 ENCOUNTER — Other Ambulatory Visit: Payer: Self-pay

## 2018-12-05 ENCOUNTER — Encounter (HOSPITAL_COMMUNITY): Payer: Self-pay

## 2018-12-05 ENCOUNTER — Emergency Department (HOSPITAL_COMMUNITY)
Admission: EM | Admit: 2018-12-05 | Discharge: 2018-12-05 | Disposition: A | Discharge status: Home / Self Care | Payer: 59 | Attending: Emergency Medicine | Admitting: Emergency Medicine

## 2018-12-05 ENCOUNTER — Emergency Department (HOSPITAL_COMMUNITY): Payer: 59

## 2018-12-05 DIAGNOSIS — M62830 Muscle spasm of back: Secondary | ICD-10-CM | POA: Diagnosis not present

## 2018-12-05 DIAGNOSIS — F17210 Nicotine dependence, cigarettes, uncomplicated: Secondary | ICD-10-CM | POA: Insufficient documentation

## 2018-12-05 DIAGNOSIS — R079 Chest pain, unspecified: Secondary | ICD-10-CM | POA: Diagnosis present

## 2018-12-05 LAB — BASIC METABOLIC PANEL
Anion gap: 9 (ref 5–15)
BUN: 14 mg/dL (ref 6–20)
CO2: 23 mmol/L (ref 22–32)
Calcium: 9.2 mg/dL (ref 8.9–10.3)
Chloride: 104 mmol/L (ref 98–111)
Creatinine, Ser: 0.7 mg/dL (ref 0.44–1.00)
GFR calc Af Amer: >60 mL/min (ref >60)
GFR calc non Af Amer: >60 mL/min (ref >60)
Glucose, Bld: 102 mg/dL — ABNORMAL HIGH (ref 70–99)
Potassium: 4.2 mmol/L (ref 3.5–5.1)
Sodium: 136 mmol/L (ref 135–145)

## 2018-12-05 LAB — CBC
HCT: 40.8 % (ref 36.0–46.0)
Hemoglobin: 13.2 g/dL (ref 12.0–15.0)
MCH: 31.4 pg (ref 26.0–34.0)
MCHC: 32.4 g/dL (ref 30.0–36.0)
MCV: 97.1 fL (ref 80.0–100.0)
Platelets: 264 10*3/uL (ref 150–400)
RBC: 4.2 MIL/uL (ref 3.87–5.11)
RDW: 12.9 % (ref 11.5–15.5)
WBC: 7.9 10*3/uL (ref 4.0–10.5)
nRBC: 0 % (ref 0.0–0.2)

## 2018-12-05 LAB — I-STAT BETA HCG BLOOD, ED (NOT ORDERABLE): I-stat hCG, quantitative: <5 m[IU]/mL (ref <5)

## 2018-12-05 LAB — D-DIMER, QUANTITATIVE: D-Dimer, Quant: 0.31 ug/mL-FEU (ref 0.00–0.50)

## 2018-12-05 LAB — POCT I-STAT TROPONIN I: Troponin i, poc: 0 ng/mL (ref 0.00–0.08)

## 2018-12-05 MED ORDER — CYCLOBENZAPRINE HCL 10 MG PO TABS: 5.0000 mg | ORAL_TABLET | Freq: Every day | ORAL | 0 refills | Status: AC

## 2018-12-05 MED ORDER — KETOROLAC TROMETHAMINE 15 MG/ML IJ SOLN
15.0000 mg | Freq: Once | INTRAMUSCULAR | Status: AC
  Administered 2018-12-05: 15 mg via INTRAVENOUS
  Filled 2018-12-05: qty 1

## 2018-12-05 MED ORDER — SODIUM CHLORIDE 0.9% FLUSH: 3.0000 mL | Freq: Once | INTRAVENOUS | Status: DC

## 2018-12-05 NOTE — Discharge Instructions (Addendum)
You may use over-the-counter Motrin (Ibuprofen), Acetaminophen (Tylenol), topical muscle creams such as SalonPas, Icy Hot, Bengay, etc. Please stretch, apply heat, and have massage therapy for additional assistance. ° °

## 2018-12-05 NOTE — ED Triage Notes (Signed)
Pt states that she was having heart palpitations last night ans then this evening at work she started having chest pain on her left side and in the middle of her back, she laid down for awhile and then states she can't take a deep breath

## 2018-12-05 NOTE — ED Provider Notes (Signed)
[facility]-EMERGENCY DEPT Provider Note  CSN: [id number] Arrival date & time: 12/05/18 0301  Chief Complaint(s) Chest Pain  HPI [name] is a 38 y.o. female   The history is provided by the patient.  Chest Pain  Pain location:  L lateral chest Pain quality: stabbing   Radiates to: right scapula. Pain severity:  Moderate Onset quality:  Sudden Duration:  11 hours Timing:  Constant Progression:  Waxing and waning Chronicity:  New Context: breathing and movement   Relieved by:  Nothing Worsened by:  Deep breathing and movement Associated symptoms: back pain and palpitations (2 days ago)   Associated symptoms: no abdominal pain, no anxiety, no cough, no diaphoresis, no fatigue, no fever, no headache, no lower extremity edema, no nausea, no shortness of breath and no weakness   Risk factors: smoking   Risk factors: no birth control, no coronary artery disease, no diabetes mellitus, no high cholesterol, no hypertension, no immobilization and no prior DVT/PE    LMP: 11/26/18  Past Medical History Past Medical History:  Diagnosis Date  . Pneumonia 10/20/2011   ON z-pak for 3 days now   There are no active problems to display for this patient.  Home Medication(s) Prior to Admission medications   Medication Sig Start Date End Date Taking? Authorizing Provider  cyclobenzaprine (FLEXERIL) 10 MG tablet Take 0.5-1 tablets (5-10 mg total) by mouth at bedtime for 10 days. 12/05/18 12/15/18  Nira Connardama, Pedro Eduardo, MD                                                                                                                                    Past Surgical History Past Surgical History:  Procedure Laterality Date  . FOOT SURGERY  March 2011 and April 2011   Bilateral foot surgery. One in March and one in April   Family History Family History  Problem Relation Age of Onset  . Anesthesia problems Mother   . Hypotension Neg Hx   . Malignant hyperthermia Neg  Hx   . Pseudochol deficiency Neg Hx     Social History Social History   Tobacco Use  . Smoking status: Current Some Day Smoker  . Smokeless tobacco: Never Used  Substance Use Topics  . Alcohol use: No  . Drug use: No   Allergies Patient has no known allergies.  Review of Systems Review of Systems  Constitutional: Negative for diaphoresis, fatigue and fever.  Respiratory: Negative for cough and shortness of breath.   Cardiovascular: Positive for chest pain and palpitations (2 days ago).  Gastrointestinal: Negative for abdominal pain and nausea.  Musculoskeletal: Positive for back pain.  Neurological: Negative for weakness and headaches.   All other systems are reviewed and are negative for acute change except as noted in the HPI  Physical Exam Vital Signs  I have reviewed the triage vital signs BP 114/86 (BP Location: Right Arm)   Pulse  64   Temp 98.8 F (37.1 C) (Oral)   Resp 17   Ht 5\' 6"  (1.676 m)   Wt 86.2 kg   LMP 11/30/2018   SpO2 99%   BMI 30.67 kg/m   Physical Exam Vitals signs reviewed.  Constitutional:      General: She is not in acute distress.    Appearance: She is well-developed. She is not diaphoretic.  HENT:     Head: Normocephalic and atraumatic.     Nose: Nose normal.  Eyes:     General: No scleral icterus.       Right eye: No discharge.        Left eye: No discharge.     Conjunctiva/sclera: Conjunctivae normal.     Pupils: Pupils are equal, round, and reactive to light.  Neck:     Musculoskeletal: Normal range of motion and neck supple.  Cardiovascular:     Rate and Rhythm: Normal rate and regular rhythm.     Heart sounds: No murmur. No friction rub. No gallop.   Pulmonary:     Effort: Pulmonary effort is normal. No respiratory distress.     Breath sounds: Normal breath sounds. No stridor. No rales.    Chest:     Chest wall: Tenderness present.    Abdominal:     General: There is no distension.     Palpations: Abdomen is soft.       Tenderness: There is no abdominal tenderness.  Musculoskeletal:        General: No tenderness.  Skin:    General: Skin is warm and dry.     Findings: No erythema or rash.  Neurological:     Mental Status: She is alert and oriented to person, place, and time.     ED Results and Treatments Labs (all labs ordered are listed, but only abnormal results are displayed) Labs Reviewed  BASIC METABOLIC PANEL - Abnormal; Notable for the following components:      Result Value   Glucose, Bld 102 (*)    All other components within normal limits  CBC  D-DIMER, QUANTITATIVE (NOT AT [facility])  I-STAT TROPONIN, ED  I-STAT BETA HCG BLOOD, ED ([facility], [facility], [facility] ONLY)  POCT I-STAT TROPONIN I  I-STAT BETA HCG BLOOD, ED ([facility], [facility], [facility] ONLY)  I-STAT BETA HCG BLOOD, ED (NOT ORDERABLE)                                                                                                                         EKG  EKG Interpretation  Date/Time:  Tuesday December 05 2018 03:22:50 EST Ventricular Rate:  61 PR Interval:    QRS Duration: 79 QT Interval:  379 QTC Calculation: 382 R Axis:   56 Text Interpretation:  Sinus rhythm No significant change since last tracing Confirmed by Drema Pry [phone]) on 12/05/2018 6:03:38 AM      Radiology Dg Chest 2 View  Result Date: 12/05/2018 CLINICAL DATA:  Mid chest pain EXAM:  CHEST - 2 VIEW COMPARISON:  03/12/2016 FINDINGS: Heart and mediastinal contours are within normal limits. No focal opacities or effusions. No acute bony abnormality. IMPRESSION: No active cardiopulmonary disease. Electronically Signed   By: Charlett NoseKevin  Dover M.D.   On: 12/05/2018 03:38   Pertinent labs & imaging results that were available during my care of the patient were reviewed by me and considered in my medical decision making (see chart for details).  Medications Ordered in ED Medications  sodium chloride flush (NS) 0.9 % injection 3 mL (has no administration in time range)  ketorolac (TORADOL)  15 MG/ML injection 15 mg (has no administration in time range)                                                                                                                                    Procedures Procedures  (including critical care time)  Medical Decision Making / ED Course I have reviewed the nursing notes for this encounter and the patient's prior records (if available in EHR or on provided paperwork).    Patient presents with left-sided chest pain most consistent with muscular strain/spasm.  It also appears to be pleuritic.  Highly inconsistent with ACS.  EKG without acute ischemic changes or evidence of pericarditis.  Initial troponin drawn in triage was negative.  No need for additional cardiac markers at this time.  Labs without leukocytosis or anemia.  No significant electrolyte derangements or renal sufficiency.  Low pretest probability for pulmonary embolism and d-dimer negative.  Presentation not classic for aortic dissection or esophageal perforation.  Chest x-ray without evidence suggestive of pneumonia, pneumothorax, pneumomediastinum.  No abnormal contour of the mediastinum to suggest dissection. No evidence of acute injuries.  Treated with Toradol for MSK pain.  The patient appears reasonably screened and/or stabilized for discharge and I doubt any other medical condition or other [facility] requiring further screening, evaluation, or treatment in the ED at this time prior to discharge.  The patient is safe for discharge with strict return precautions.   Final Clinical Impression(s) / ED Diagnoses Final diagnoses:  Muscle spasm of back    Disposition: Discharge  Condition: Good  I have discussed the results, Dx and Tx plan with the patient who expressed understanding and agree(s) with the plan. Discharge instructions discussed at great length. The patient was given strict return precautions who verbalized understanding of the instructions. No further questions  at time of discharge.    ED Discharge Orders         Ordered    cyclobenzaprine (FLEXERIL) 10 MG tablet  Daily at bedtime     12/05/18 0746           Follow Up: Primary care provider  Schedule an appointment as soon as possible for a visit  If you do not have a primary care physician, contact [facility] at [phone] for referral     This chart was dictated using  voice recognition software.  Despite best efforts to proofread,  errors can occur which can change the documentation meaning.   Nira Conn, MD 12/05/18 [phone]

## 2019-02-19 IMAGING — CR DG CHEST 2V
2 series · 2 of 2 positions shown · non-contrast
Comparison: 03/12/2016

CLINICAL DATA: Mid chest pain

EXAM:
CHEST - 2 VIEW

[w chest pa]
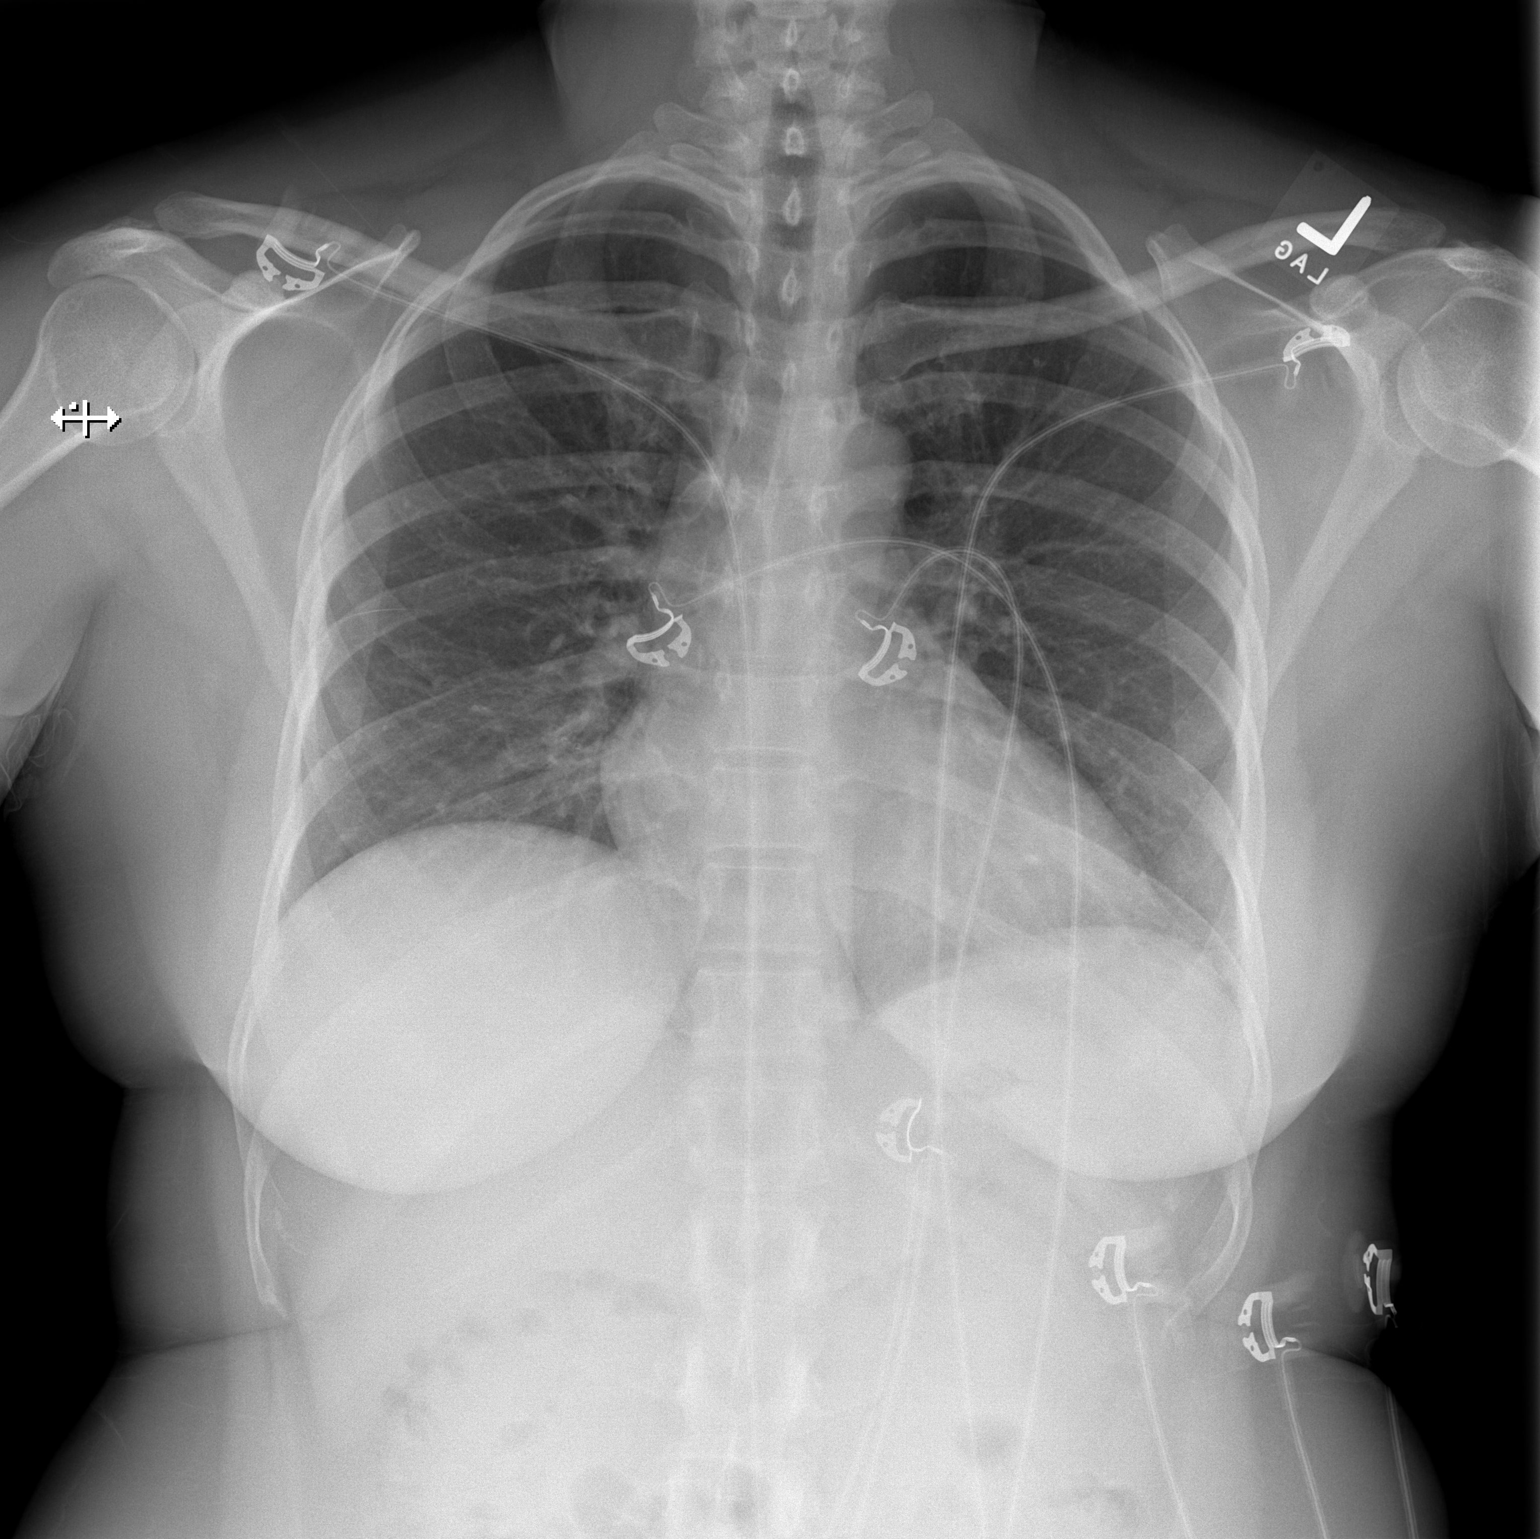

[w chest lat]
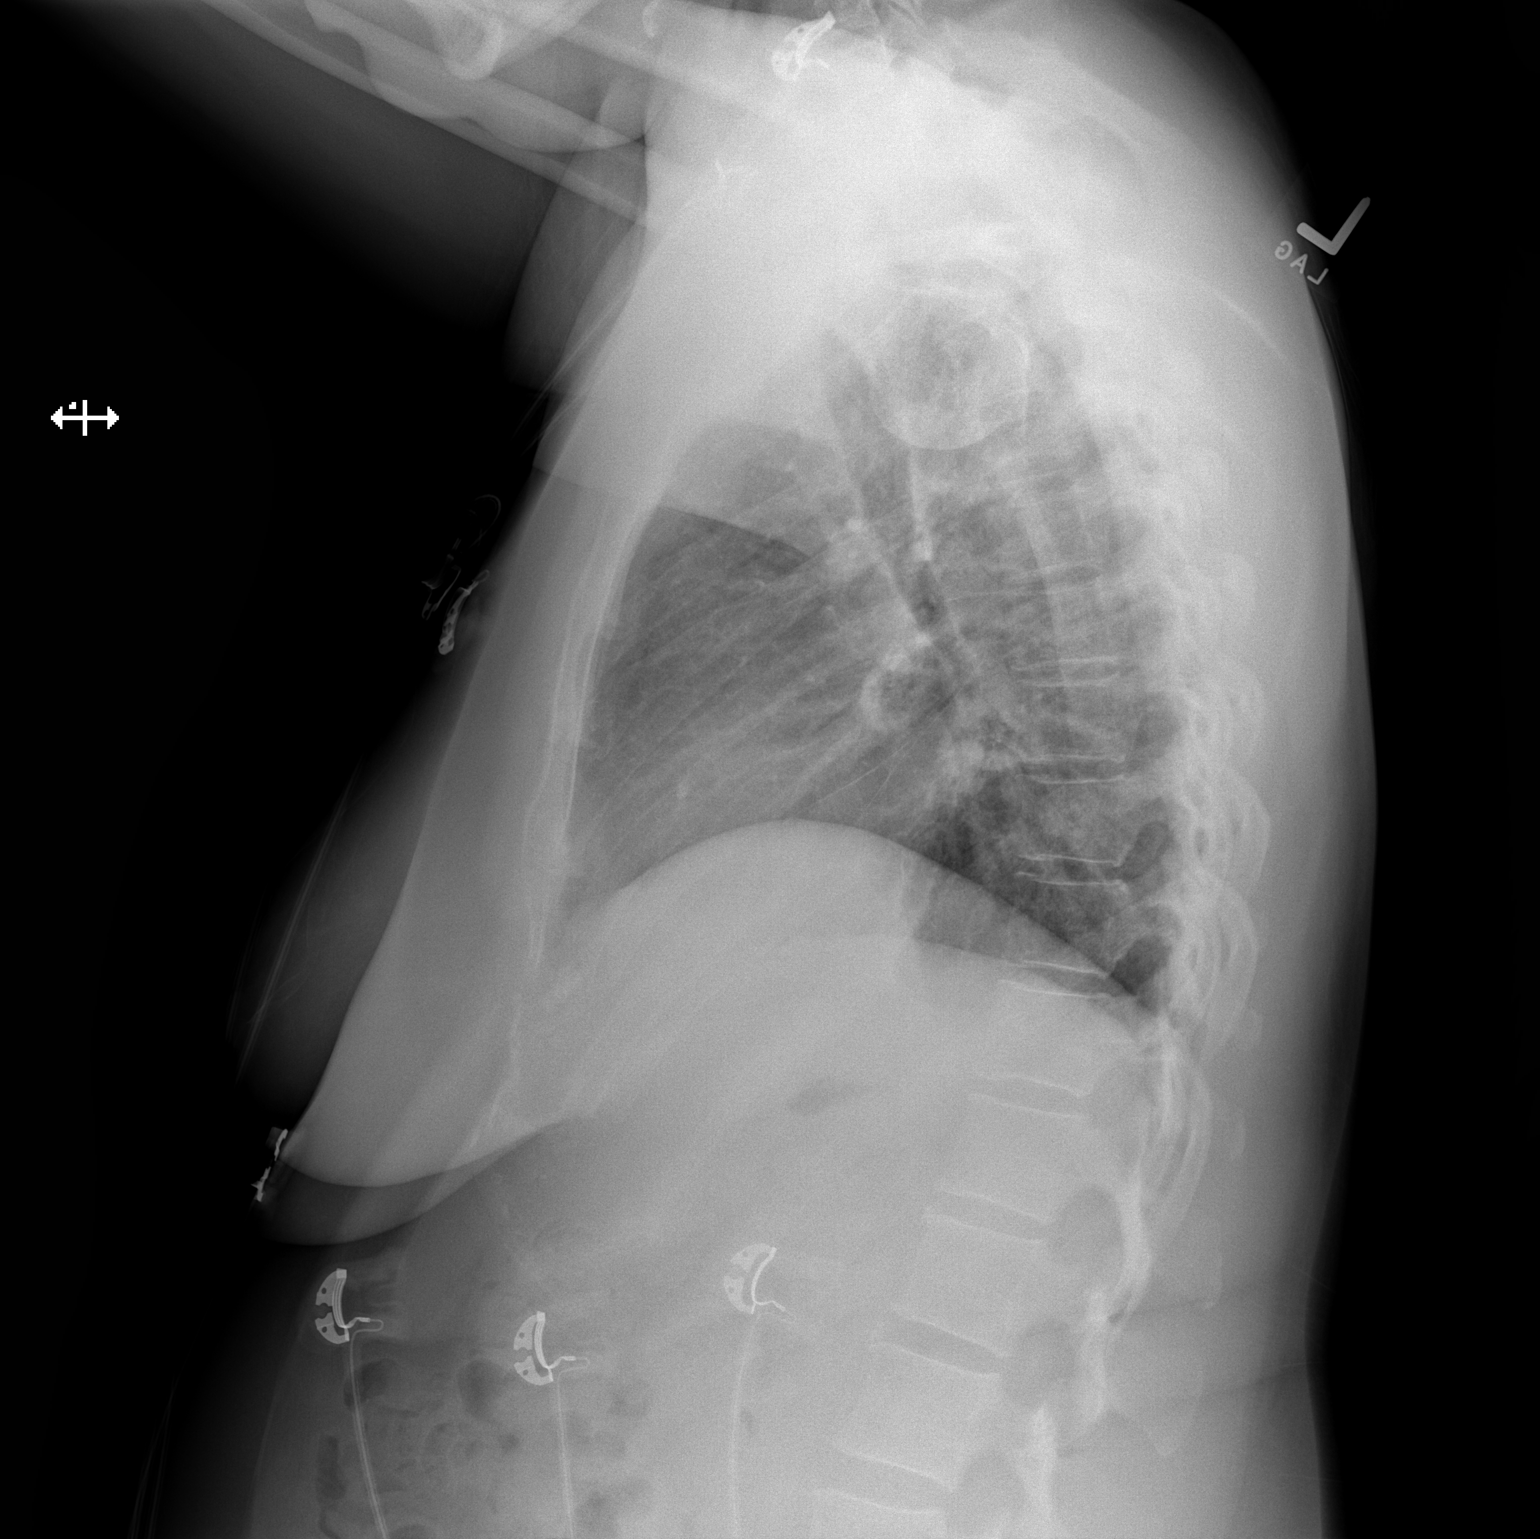

[2 of 2 positions shown; findings below may reference images not displayed]

FINDINGS: Heart and mediastinal contours are within normal limits. No focal
opacities or effusions. No acute bony abnormality.
IMPRESSION: No active cardiopulmonary disease.

## 2020-03-13 ENCOUNTER — Ambulatory Visit: Payer: 59 | Attending: Internal Medicine

## 2020-03-13 DIAGNOSIS — Z23 Encounter for immunization: Secondary | ICD-10-CM

## 2020-03-13 NOTE — Progress Notes (Signed)
   Covid-19 Vaccination Clinic  Name:  [name]    MRN: [id number] DOB: [birth year]  03/13/2020  [name] was observed post Covid-19 immunization for 15 minutes without incident. She was provided with Vaccine Information Sheet and instruction to access the V-Safe system.   [name] was instructed to call 911 with any severe reactions post vaccine: Marland Kitchen Difficulty breathing  . Swelling of face and throat  . A fast heartbeat  . A bad rash all over body  . Dizziness and weakness   Immunizations Administered    Name Date Dose VIS Date Route   Pfizer COVID-19 Vaccine 03/13/2020  4:00 PM 0.3 mL 12/19/2018 Intramuscular   Manufacturer: [employer], [employer]   Lot: [id number]   NDC: [address]-2

## 2020-04-07 ENCOUNTER — Ambulatory Visit: Payer: 59 | Attending: Internal Medicine

## 2020-04-07 DIAGNOSIS — Z23 Encounter for immunization: Secondary | ICD-10-CM

## 2020-04-07 NOTE — Progress Notes (Signed)
   Covid-19 Vaccination Clinic  Name:  [name]    MRN: [id number] DOB: [birth year]  04/07/2020  [name] was observed post Covid-19 immunization for 15 minutes without incident. She was provided with Vaccine Information Sheet and instruction to access the V-Safe system.   [name] was instructed to call 911 with any severe reactions post vaccine: Marland Kitchen Difficulty breathing  . Swelling of face and throat  . A fast heartbeat  . A bad rash all over body  . Dizziness and weakness   Immunizations Administered    Name Date Dose VIS Date Route   Pfizer COVID-19 Vaccine 04/07/2020  3:33 PM 0.3 mL 12/19/2018 Intramuscular   Manufacturer: [employer], [employer]   Lot: [id number]   NDC: [address]-1

## 2020-12-26 ENCOUNTER — Other Ambulatory Visit: Payer: Self-pay

## 2020-12-26 ENCOUNTER — Other Ambulatory Visit: Payer: Self-pay | Admitting: Obstetrics and Gynecology

## 2020-12-26 ENCOUNTER — Encounter (HOSPITAL_COMMUNITY): Payer: Self-pay | Admitting: Obstetrics & Gynecology

## 2020-12-26 ENCOUNTER — Inpatient Hospital Stay (HOSPITAL_COMMUNITY)
Admission: AD | Admit: 2020-12-26 | Discharge: 2020-12-26 | Disposition: A | Discharge status: Home / Self Care | Payer: 59 | Attending: Obstetrics & Gynecology | Admitting: Obstetrics & Gynecology

## 2020-12-26 DIAGNOSIS — Z3A00 Weeks of gestation of pregnancy not specified: Secondary | ICD-10-CM | POA: Diagnosis not present

## 2020-12-26 DIAGNOSIS — O009 Unspecified ectopic pregnancy without intrauterine pregnancy: Secondary | ICD-10-CM | POA: Diagnosis not present

## 2020-12-26 HISTORY — DX: Unspecified ectopic pregnancy without intrauterine pregnancy: O00.90

## 2020-12-26 LAB — URINALYSIS, ROUTINE W REFLEX MICROSCOPIC
Bilirubin Urine: NEGATIVE
Glucose, UA: NEGATIVE mg/dL
Ketones, ur: 20 mg/dL — AB
Leukocytes,Ua: NEGATIVE
Nitrite: NEGATIVE
Protein, ur: NEGATIVE mg/dL
Specific Gravity, Urine: 1.02 (ref 1.005–1.030)
pH: 6 (ref 5.0–8.0)

## 2020-12-26 LAB — COMPREHENSIVE METABOLIC PANEL
ALT: 31 U/L (ref 0–44)
AST: 27 U/L (ref 15–41)
Albumin: 4.4 g/dL (ref 3.5–5.0)
Alkaline Phosphatase: 45 U/L (ref 38–126)
Anion gap: 10 (ref 5–15)
BUN: 8 mg/dL (ref 6–20)
CO2: 22 mmol/L (ref 22–32)
Calcium: 9.7 mg/dL (ref 8.9–10.3)
Chloride: 101 mmol/L (ref 98–111)
Creatinine, Ser: 0.68 mg/dL (ref 0.44–1.00)
GFR, Estimated: >60 mL/min (ref >60)
Glucose, Bld: 89 mg/dL (ref 70–99)
Potassium: 3.6 mmol/L (ref 3.5–5.1)
Sodium: 133 mmol/L — ABNORMAL LOW (ref 135–145)
Total Bilirubin: 0.6 mg/dL (ref 0.3–1.2)
Total Protein: 7 g/dL (ref 6.5–8.1)

## 2020-12-26 LAB — CBC WITH DIFFERENTIAL/PLATELET
Abs Immature Granulocytes: 0.03 10*3/uL (ref 0.00–0.07)
Basophils Absolute: 0 10*3/uL (ref 0.0–0.1)
Basophils Relative: 0 %
Eosinophils Absolute: 0.2 10*3/uL (ref 0.0–0.5)
Eosinophils Relative: 3 %
HCT: 41.1 % (ref 36.0–46.0)
Hemoglobin: 14 g/dL (ref 12.0–15.0)
Immature Granulocytes: 0 %
Lymphocytes Relative: 30 %
Lymphs Abs: 2.9 10*3/uL (ref 0.7–4.0)
MCH: 31.3 pg (ref 26.0–34.0)
MCHC: 34.1 g/dL (ref 30.0–36.0)
MCV: 91.9 fL (ref 80.0–100.0)
Monocytes Absolute: 0.6 10*3/uL (ref 0.1–1.0)
Monocytes Relative: 6 %
Neutro Abs: 5.8 10*3/uL (ref 1.7–7.7)
Neutrophils Relative %: 61 %
Platelets: 273 10*3/uL (ref 150–400)
RBC: 4.47 MIL/uL (ref 3.87–5.11)
RDW: 13 % (ref 11.5–15.5)
WBC: 9.6 10*3/uL (ref 4.0–10.5)
nRBC: 0 % (ref 0.0–0.2)

## 2020-12-26 LAB — HCG, QUANTITATIVE, PREGNANCY: hCG, Beta Chain, Quant, S: 5872 m[IU]/mL — ABNORMAL HIGH (ref <5)

## 2020-12-26 MED ORDER — METHOTREXATE SODIUM CHEMO INJECTION 250 MG/10ML
50.0000 mg/m2 | Freq: Once | INTRAMUSCULAR | Status: AC
  Administered 2020-12-26: 102.5 mg via INTRAMUSCULAR
  Filled 2020-12-26: qty 4.1

## 2020-12-26 MED ORDER — METHOTREXATE FOR ECTOPIC PREGNANCY
50.0000 mg/m2 | Freq: Once | INTRAMUSCULAR | Status: DC
  Filled 2020-12-26 (×2): qty 1

## 2020-12-26 NOTE — Progress Notes (Addendum)
Spoke with RN, received reports of CBC. CMP and quant. Sono was done in office, no IUP noted, she was sent to [facility] for labs and Methotrexate by Dr Law Proceed with Methotrexate. Day 4 and day 7 labs in office. Will have pt call on Monday 3/7 to schedule both labs and f/up with Dr Law on Day 7  

## 2020-12-26 NOTE — MAU Note (Signed)
Pt sent from MD office for ectopic pregnancy.  Pt reports informed she would receive blood work ans possible shot for ectopic pregnancy.  Denies VB, bled earlier during the week.  LMP 11/19/2020.

## 2020-12-26 NOTE — MAU Provider Note (Signed)
None     S [name] is a 40 y.o. [phone] patient who presents to [facility]U from the office for methotrexate administration due to ectopic pregnancy which was diagnosed in the office. Denies abdominal pain. Reports some intermittent vaginal bleeding this week. Also reports right flank pain intermittently last night.   O BP 132/81 (BP Location: Right Arm)   Pulse 89   Temp 98.6 F (37 C) (Oral)   Resp 18   Ht 5\' 6"  (1.676 m)   Wt 88.1 kg   LMP 11/19/2020   SpO2 99%   BMI 31.34 kg/m  Physical Exam Vitals and nursing note reviewed.  Constitutional:      General: She is not in acute distress.    Appearance: Normal appearance.  HENT:     Head: Normocephalic and atraumatic.  Pulmonary:     Effort: Pulmonary effort is normal. No respiratory distress.  Abdominal:     Tenderness: There is no right CVA tenderness or left CVA tenderness.  Neurological:     Mental Status: She is alert.  Psychiatric:        Mood and Affect: Mood normal.        Behavior: Behavior normal.     A Medical screening exam complete 1. Ectopic pregnancy, unspecified location, unspecified whether intrauterine pregnancy present      P Medical screening exam complete. Labs & methotrexate order placed by [facility] provider. RN reviewed lab results with Dr. [facility].  Discharge orders placed by me Patient states she is scheduled for f/u labs at [facility], [facility], NP 12/26/2020 6:56 PM

## 2020-12-26 NOTE — Progress Notes (Signed)
Dr. Juliene Pina will return RN call secondary @ office it patient room.

## 2020-12-26 NOTE — Progress Notes (Incomplete Revision)
Spoke with RN, received reports of CBC. CMP and quant. Sono was done in office, no IUP noted, she was sent to [facility] for labs and Methotrexate by Dr Conni Elliot Proceed with Methotrexate. Day 4 and day 7 labs in office. Will have pt call on Monday 3/7 to schedule both labs and f/up with Dr Conni Elliot on Day 7

## 2020-12-26 NOTE — Discharge Instructions (Signed)
Methotrexate Treatment for an Ectopic Pregnancy Methotrexate is a medicine that treats an ectopic pregnancy. In this type of pregnancy, the fertilized egg attaches (implants) outside the uterus. An ectopic pregnancy cannot develop into a healthy baby. Methotrexate works by stopping the growth of the fertilized egg. It also helps the body absorb tissue from the egg. This takes about 2-6 weeks. An ectopic pregnancy can be life-threatening. However, most ectopic pregnancies can be successfully treated with methotrexate if they are diagnosed early. Tell a health care provider about:  Any allergies you have.  All medicines you are taking, including vitamins, herbs, eye drops, creams, and over-the-counter medicines.  Any medical conditions you have. What are the risks? Generally, this is a safe treatment. However, problems may occur, including:  Digestive problems. You may have: ? Nausea. ? Vomiting. ? Diarrhea. ? Cramping in your abdomen.  Bleeding or spotting from your vagina.  Feeling dizzy or light-headed.  Mouth sores.  Inflammation of the lining of your lungs (pneumonitis).  Damage to nearby structures or organs, such as damage to the liver.  Hair loss. There is a risk that methotrexate treatment will fail and the pregnancy will continue. There is also a risk that the ectopic pregnancy might tear or burst (rupture) during use of this medicine. What happens before the procedure?  Blood tests will be done to check how your disease-fighting system (immune system), liver, and kidneys are working.  You will also have blood tests to measure your pregnancy hormone levels and to find out your blood type.  You will be given a shot of a medicine called Rho(D) immune globulin if: ? You are Rh-negative and the father is Rh-positive. ? You are Rh-negative and the father's Rh type is unknown. What happens during the procedure?  Methotrexate will be injected into your  muscle. ? Methotrexate may be given as a single dose of medicine or a series of doses over time, depending on your response to the treatment. ? Methotrexate injections are given by a health care provider. Injection is the most common way that this medicine is used to treat an ectopic pregnancy.  You may also receive other medicines to manage your ectopic pregnancy. The procedure may vary among health care providers and hospitals. What can I expect after treatment? After your treatment, it is common to have:  Cramping in your abdomen.  Bleeding in your vagina.  Tiredness (fatigue).  Nausea.  Vomiting.  Diarrhea. Blood tests will be done at timed intervals for several days or weeks to check your pregnancy hormone levels. The blood tests will be done until the pregnancy hormone can no longer be found in the blood. If the methotrexate treatment does not work, a surgical procedure may be done to remove the ectopic pregnancy. Follow these instructions at home: Medicines  Take over-the-counter and prescription medicines only as told by your health care provider.  Do not take prescription pain medicines, aspirin, ibuprofen, naproxen, or any other NSAIDs.  Do not take folic acid, prenatal vitamins, or other vitamins that contain folic acid. Activity  Do not have sex, douche, or put anything, such as tampons, in your vagina until your health care provider says it is okay.  Limit activities that take a lot of effort as told by your health care provider. General instructions  Do not drink alcohol.  Follow instructions from your health care provider about eating restrictions, such as avoiding foods that produce a lot of gas. These foods can hide the signs of a   ruptured ectopic pregnancy.  Limit exposure to sunlight or artificial UV light such as from tanning beds. Methotrexate can make you more sensitive to the sun.  Follow instructions from your health care provider on how and when to  report any symptoms that may indicate a ruptured ectopic pregnancy.  Keep all follow-up visits. This is important.   Contact a health care provider if:  You have persistent nausea and vomiting.  You have persistent diarrhea.  You are having a reaction to the medicine. This may include: ? Unusual fatigue. ? Skin rash. Get help right away if:  Pain in your abdomen or in the area between your hip bones (pelvic area) gets worse.  You have more bleeding from your vagina.  You feel light-headed or you faint.  You are short of breath.  Your heart rate increases.  You develop a cough.  You have chills or a fever. Summary  Methotrexate is a medicine that treats an ectopic pregnancy. This type of pregnancy forms outside the uterus.  There is a risk that methotrexate treatment will fail and the pregnancy will continue. There is also a risk that the ectopic pregnancy might tear or burst during use of this medicine.  This medicine may be given in a single dose or a series of doses over time.  After your treatment, blood tests will be done at timed intervals for several days or weeks to check your pregnancy hormone levels. The blood tests will be done until no more pregnancy hormone is found in the blood. This information is not intended to replace advice given to you by your health care provider. Make sure you discuss any questions you have with your health care provider. Document Revised: 03/26/2020 Document Reviewed: 03/26/2020 Elsevier Patient Education  2021 Elsevier Inc.   Ectopic Pregnancy  An ectopic pregnancy happens when a fertilized egg attaches (implants) outside the uterus. In a normal pregnancy, a fertilized egg implants in the uterus. An ectopic pregnancy cannot develop into a healthy baby. Most ectopic pregnancies occur in one of the fallopian tubes, which is where an egg travels from an ovary to get to the uterus. This is called a tubal pregnancy. An ectopic pregnancy  can also happen on an ovary, on the cervix, or in the abdomen. When a fertilized egg implants on tissue outside the uterus and begins to grow, it may cause the tissue to tear or burst. This is known as a ruptured ectopic pregnancy. The tear or burst causes internal bleeding. This may cause intense pain in the abdomen. An ectopic pregnancy is a medical emergency and can be life-threatening. What are the causes? The most common cause of this condition is damage to one of the fallopian tubes. A fallopian tube may be narrowed or blocked, and that stops the fertilized egg from reaching the uterus. Sometimes, the cause of this condition is not known. What increases the risk? The following factors may make you more likely to develop this condition:  Having gone through infertility treatment before.  Having had an ectopic pregnancy before.  Having had surgery to have the fallopian tubes tied.  Becoming pregnant while using an intrauterine device for birth control.  Taking birth control pills before the age of 16. Other risk factors include:  Smoking.  Alcohol use.  History of DES exposure. DES is a medicine that was used until 1971 and affected babies whose mothers took the medicine. What are the signs or symptoms? Common symptoms of this condition include:  Missing  a menstrual period.  Nausea or tiredness.  Tender breasts.  Other normal pregnancy symptoms. Other symptoms may include:  Pain during sex.  Vaginal bleeding or spotting.  Cramping or pain in the lower abdomen.  A fast heartbeat, low blood pressure, and sweating.  Pain or increased pressure while having a bowel movement. Symptoms of a ruptured ectopic pregnancy and internal bleeding may include:  Sudden, severe pain in the abdomen.  Dizziness, weakness, feeling light-headed, or fainting.  Pain in the shoulder or neck area. How is this diagnosed? This condition is diagnosed by:  A blood test to check for the  pregnancy hormone.  A pelvic exam to find painful areas or a mass in the abdomen.  Ultrasound. A probe is inserted into the vagina to see if there is a pregnancy in or outside the uterus.  Taking a sample of tissue from the uterus.  Surgery to look closely at the fallopian tubes through an incision in the abdomen. How is this treated? This condition is usually treated with medicine or surgery. Sometimes, ectopic pregnancies can resolve on their own, under close monitoring by your health care provider. Medicine A medicine called methotrexate may be given to cause the pregnancy tissue to be absorbed. The medicine may be given if:  The diagnosis is made early, with no signs of active bleeding.  The fallopian tube has not torn or burst. You will need blood tests to make sure the medicine is working. It may take 4-6 weeks for the pregnancy tissues to be absorbed. Surgery Surgery may be performed to:  Remove the pregnancy tissue.  Stop internal bleeding.  Remove part or all of the fallopian tube.  Remove the uterus. This is rare. After surgery, you may need to have blood tests to make sure the surgery worked. Follow these instructions at home: Medicines  Take over-the-counter and prescription medicines only as told by your health care provider.  Ask your health care provider if the medicine prescribed to you: ? Requires you to avoid driving or using machinery. ? Can cause constipation. You may need to take these actions to prevent or treat constipation:  Drink enough fluid to keep your urine pale yellow.  Take over-the-counter or prescription medicines.  Eat foods that are high in fiber, such as beans, whole grains, and fresh fruits and vegetables.  Limit foods that are high in fat and processed sugars, such as fried or sweet foods. General instructions  Rest or limit your activity, if told by your health care provider.  Do not have sex or put anything in your vagina, such  as tampons or douches, for 6 weeks or until your health care provider says it is safe.  Do not lift anything that is heavier than 10 lb (4.5 kg), or the limit that you are told, until your health care provider says that it is safe.  Return to your normal activities as told by your health care provider. Ask your health care provider what activities are safe for you.  Keep all follow-up visits. This is important. Contact a health care provider if:  You have a fever or chills.  You have nausea and vomiting. Get help right away if:  Your pain gets worse or is not relieved by medicine.  You feel dizzy or weak.  You feel light-headed or you faint.  You have sudden, severe pain in your abdomen.  You have sudden pain in the shoulder or neck area. Summary  An ectopic pregnancy happens when a  fertilized egg implants outside the uterus. Most ectopic pregnancies occur in one of the fallopian tubes.  An ectopic pregnancy is a medical emergency and can be life-threatening.  The most common cause of this condition is damage to one of the fallopian tubes.  This condition is usually treated with medicine or surgery. Some ectopic pregnancies resolve on their own, under close monitoring by your health care provider. This information is not intended to replace advice given to you by your health care provider. Make sure you discuss any questions you have with your health care provider. Document Revised: 01/22/2020 Document Reviewed: 01/22/2020 Elsevier Patient Education  2021 [employer].

## 2020-12-26 NOTE — Progress Notes (Signed)
Pt given update regarding POC.  Informed this RN spoke to Dr. Juliene Pina regarding lab results and MD ordered Methotrexate be administered.  Pt also  informed lab results WNL and she is to f/u @ office on Monday.  Pt verbalized understanding & agreeable. Currently awaiting to receive Methotrexate from pharmacy.

## 2021-01-01 ENCOUNTER — Encounter: Payer: Self-pay | Admitting: Obstetrics and Gynecology

## 2021-01-01 ENCOUNTER — Other Ambulatory Visit: Payer: Self-pay | Admitting: Obstetrics and Gynecology

## 2021-01-01 DIAGNOSIS — O009 Unspecified ectopic pregnancy without intrauterine pregnancy: Secondary | ICD-10-CM

## 2021-01-02 ENCOUNTER — Ambulatory Visit (HOSPITAL_BASED_OUTPATIENT_CLINIC_OR_DEPARTMENT_OTHER)
Admission: RE | Admit: 2021-01-02 | Discharge: 2021-01-02 | Disposition: A | Discharge status: Home / Self Care | Payer: 59 | Source: Ambulatory Visit | Attending: Obstetrics and Gynecology | Admitting: Obstetrics and Gynecology

## 2021-01-02 ENCOUNTER — Encounter (HOSPITAL_BASED_OUTPATIENT_CLINIC_OR_DEPARTMENT_OTHER)
Admission: RE | Disposition: A | Discharge status: Home / Self Care | Payer: Self-pay | Source: Ambulatory Visit | Attending: Obstetrics and Gynecology

## 2021-01-02 ENCOUNTER — Ambulatory Visit (HOSPITAL_BASED_OUTPATIENT_CLINIC_OR_DEPARTMENT_OTHER): Payer: 59 | Admitting: Anesthesiology

## 2021-01-02 ENCOUNTER — Encounter (HOSPITAL_BASED_OUTPATIENT_CLINIC_OR_DEPARTMENT_OTHER): Payer: Self-pay | Admitting: Obstetrics and Gynecology

## 2021-01-02 DIAGNOSIS — Z302 Encounter for sterilization: Secondary | ICD-10-CM | POA: Insufficient documentation

## 2021-01-02 DIAGNOSIS — O009 Unspecified ectopic pregnancy without intrauterine pregnancy: Secondary | ICD-10-CM | POA: Insufficient documentation

## 2021-01-02 DIAGNOSIS — O99330 Smoking (tobacco) complicating pregnancy, unspecified trimester: Secondary | ICD-10-CM | POA: Diagnosis not present

## 2021-01-02 DIAGNOSIS — Z3A00 Weeks of gestation of pregnancy not specified: Secondary | ICD-10-CM | POA: Diagnosis not present

## 2021-01-02 DIAGNOSIS — F17200 Nicotine dependence, unspecified, uncomplicated: Secondary | ICD-10-CM | POA: Insufficient documentation

## 2021-01-02 DIAGNOSIS — Z20822 Contact with and (suspected) exposure to covid-19: Secondary | ICD-10-CM | POA: Diagnosis not present

## 2021-01-02 HISTORY — PX: DIAGNOSTIC LAPAROSCOPY WITH REMOVAL OF ECTOPIC PREGNANCY: SHX6449

## 2021-01-02 LAB — CBC WITH DIFFERENTIAL/PLATELET
Abs Immature Granulocytes: 0.06 10*3/uL (ref 0.00–0.07)
Basophils Absolute: 0 10*3/uL (ref 0.0–0.1)
Basophils Relative: 0 %
Eosinophils Absolute: 0.2 10*3/uL (ref 0.0–0.5)
Eosinophils Relative: 2 %
HCT: 37.5 % (ref 36.0–46.0)
Hemoglobin: 12.7 g/dL (ref 12.0–15.0)
Immature Granulocytes: 1 %
Lymphocytes Relative: 15 %
Lymphs Abs: 1.6 10*3/uL (ref 0.7–4.0)
MCH: 31.8 pg (ref 26.0–34.0)
MCHC: 33.9 g/dL (ref 30.0–36.0)
MCV: 93.8 fL (ref 80.0–100.0)
Monocytes Absolute: 0.6 10*3/uL (ref 0.1–1.0)
Monocytes Relative: 5 %
Neutro Abs: 8.6 10*3/uL — ABNORMAL HIGH (ref 1.7–7.7)
Neutrophils Relative %: 77 %
Platelets: 294 10*3/uL (ref 150–400)
RBC: 4 MIL/uL (ref 3.87–5.11)
RDW: 13.1 % (ref 11.5–15.5)
WBC: 11.1 10*3/uL — ABNORMAL HIGH (ref 4.0–10.5)
nRBC: 0 % (ref 0.0–0.2)

## 2021-01-02 LAB — TYPE AND SCREEN
ABO/RH(D): O POS
Antibody Screen: NEGATIVE

## 2021-01-02 LAB — RESP PANEL BY RT-PCR (FLU A&B, COVID) ARPGX2
Influenza A by PCR: NEGATIVE
Influenza B by PCR: NEGATIVE
SARS Coronavirus 2 by RT PCR: NEGATIVE

## 2021-01-02 LAB — ABO/RH: ABO/RH(D): O POS

## 2021-01-02 SURGERY — LAPAROSCOPY, WITH ECTOPIC PREGNANCY SURGICAL TREATMENT
Anesthesia: General | Site: Abdomen | Laterality: Left

## 2021-01-02 MED ORDER — ONDANSETRON HCL 4 MG/2ML IJ SOLN
INTRAMUSCULAR | Status: AC
  Filled 2021-01-02: qty 2

## 2021-01-02 MED ORDER — OXYCODONE HCL 5 MG PO TABS
ORAL_TABLET | ORAL | Status: AC
  Filled 2021-01-02: qty 1

## 2021-01-02 MED ORDER — ACETAMINOPHEN 500 MG PO TABS
1000.0000 mg | ORAL_TABLET | Freq: Once | ORAL | Status: AC
  Administered 2021-01-02: 1000 mg via ORAL

## 2021-01-02 MED ORDER — AMISULPRIDE (ANTIEMETIC) 5 MG/2ML IV SOLN: 10.0000 mg | Freq: Once | INTRAVENOUS | Status: DC | PRN

## 2021-01-02 MED ORDER — FENTANYL CITRATE (PF) 250 MCG/5ML IJ SOLN
INTRAMUSCULAR | Status: AC
  Filled 2021-01-02: qty 5

## 2021-01-02 MED ORDER — ACETAMINOPHEN 500 MG PO TABS
ORAL_TABLET | ORAL | Status: AC
  Filled 2021-01-02: qty 2

## 2021-01-02 MED ORDER — KETOROLAC TROMETHAMINE 30 MG/ML IJ SOLN
INTRAMUSCULAR | Status: DC | PRN
  Administered 2021-01-02: 30 mg via INTRAVENOUS

## 2021-01-02 MED ORDER — SODIUM CHLORIDE 0.9 % IR SOLN
Status: DC | PRN
  Administered 2021-01-02: 3000 mL

## 2021-01-02 MED ORDER — DEXAMETHASONE SODIUM PHOSPHATE 4 MG/ML IJ SOLN
INTRAMUSCULAR | Status: DC | PRN
  Administered 2021-01-02: 8 mg via INTRAVENOUS

## 2021-01-02 MED ORDER — OXYCODONE HCL 5 MG/5ML PO SOLN: 5.0000 mg | Freq: Once | ORAL | Status: AC | PRN

## 2021-01-02 MED ORDER — OXYCODONE HCL 5 MG PO TABS: 5.0000 mg | ORAL_TABLET | Freq: Four times a day (QID) | ORAL | 0 refills | Status: AC | PRN

## 2021-01-02 MED ORDER — ONDANSETRON HCL 4 MG/2ML IJ SOLN
INTRAMUSCULAR | Status: DC | PRN
  Administered 2021-01-02: 4 mg via INTRAVENOUS

## 2021-01-02 MED ORDER — PROMETHAZINE HCL 25 MG/ML IJ SOLN
6.2500 mg | INTRAMUSCULAR | Status: DC | PRN
Start: 2021-01-02 — End: 2021-01-02

## 2021-01-02 MED ORDER — KETOROLAC TROMETHAMINE 30 MG/ML IJ SOLN
INTRAMUSCULAR | Status: AC
  Filled 2021-01-02: qty 1

## 2021-01-02 MED ORDER — SCOPOLAMINE 1 MG/3DAYS TD PT72
1.0000 | MEDICATED_PATCH | Freq: Once | TRANSDERMAL | Status: DC
  Administered 2021-01-02: 1.5 mg via TRANSDERMAL

## 2021-01-02 MED ORDER — SUGAMMADEX SODIUM 200 MG/2ML IV SOLN
INTRAVENOUS | Status: DC | PRN
  Administered 2021-01-02: 200 mg via INTRAVENOUS

## 2021-01-02 MED ORDER — DEXAMETHASONE SODIUM PHOSPHATE 10 MG/ML IJ SOLN
INTRAMUSCULAR | Status: AC
  Filled 2021-01-02: qty 1

## 2021-01-02 MED ORDER — PROPOFOL 10 MG/ML IV BOLUS
INTRAVENOUS | Status: AC
  Filled 2021-01-02: qty 40

## 2021-01-02 MED ORDER — OXYCODONE HCL 5 MG PO TABS
5.0000 mg | ORAL_TABLET | Freq: Once | ORAL | Status: AC | PRN
  Administered 2021-01-02: 5 mg via ORAL

## 2021-01-02 MED ORDER — ROCURONIUM BROMIDE 100 MG/10ML IV SOLN
INTRAVENOUS | Status: DC | PRN
  Administered 2021-01-02: 5 mg via INTRAVENOUS
  Administered 2021-01-02: 50 mg via INTRAVENOUS

## 2021-01-02 MED ORDER — LIDOCAINE HCL (CARDIAC) PF 100 MG/5ML IV SOSY
PREFILLED_SYRINGE | INTRAVENOUS | Status: DC | PRN
  Administered 2021-01-02: 60 mg via INTRAVENOUS

## 2021-01-02 MED ORDER — BUPIVACAINE HCL (PF) 0.25 % IJ SOLN
INTRAMUSCULAR | Status: DC | PRN
  Administered 2021-01-02: 10 mL

## 2021-01-02 MED ORDER — LACTATED RINGERS IV SOLN: INTRAVENOUS | Status: DC

## 2021-01-02 MED ORDER — IBUPROFEN 800 MG PO TABS: 800.0000 mg | ORAL_TABLET | Freq: Three times a day (TID) | ORAL | 0 refills | Status: AC | PRN

## 2021-01-02 MED ORDER — SCOPOLAMINE 1 MG/3DAYS TD PT72
MEDICATED_PATCH | TRANSDERMAL | Status: AC
  Filled 2021-01-02: qty 1

## 2021-01-02 MED ORDER — KETOROLAC TROMETHAMINE 30 MG/ML IJ SOLN: 30.0000 mg | Freq: Once | INTRAMUSCULAR | Status: DC | PRN

## 2021-01-02 MED ORDER — FENTANYL CITRATE (PF) 100 MCG/2ML IJ SOLN
INTRAMUSCULAR | Status: DC | PRN
  Administered 2021-01-02 (×2): 50 ug via INTRAVENOUS
  Administered 2021-01-02 (×2): 25 ug via INTRAVENOUS
  Administered 2021-01-02 (×2): 50 ug via INTRAVENOUS

## 2021-01-02 MED ORDER — LIDOCAINE 2% (20 MG/ML) 5 ML SYRINGE
INTRAMUSCULAR | Status: AC
  Filled 2021-01-02: qty 5

## 2021-01-02 MED ORDER — PROPOFOL 10 MG/ML IV BOLUS
INTRAVENOUS | Status: DC | PRN
  Administered 2021-01-02: 40 mg via INTRAVENOUS
  Administered 2021-01-02: 150 mg via INTRAVENOUS
  Administered 2021-01-02 (×2): 50 mg via INTRAVENOUS

## 2021-01-02 MED ORDER — FENTANYL CITRATE (PF) 100 MCG/2ML IJ SOLN
25.0000 ug | INTRAMUSCULAR | Status: DC | PRN
  Administered 2021-01-02: 25 ug via INTRAVENOUS

## 2021-01-02 MED ORDER — PROPOFOL 10 MG/ML IV BOLUS
INTRAVENOUS | Status: AC
  Filled 2021-01-02: qty 20

## 2021-01-02 MED ORDER — MIDAZOLAM HCL 2 MG/2ML IJ SOLN
INTRAMUSCULAR | Status: DC | PRN
  Administered 2021-01-02: 1 mg via INTRAVENOUS

## 2021-01-02 MED ORDER — FENTANYL CITRATE (PF) 100 MCG/2ML IJ SOLN
INTRAMUSCULAR | Status: AC
  Filled 2021-01-02: qty 2

## 2021-01-02 MED ORDER — MIDAZOLAM HCL 2 MG/2ML IJ SOLN
INTRAMUSCULAR | Status: AC
  Filled 2021-01-02: qty 2

## 2021-01-02 SURGICAL SUPPLY — 38 items
ADH SKN CLS APL DERMABOND .7 (GAUZE/BANDAGES/DRESSINGS) ×1
BAG SPEC RTRVL LRG 6X4 10 (ENDOMECHANICALS)
CABLE HIGH FREQUENCY MONO STRZ (ELECTRODE) IMPLANT
COVER WAND RF STERILE (DRAPES) ×2 IMPLANT
DERMABOND ADVANCED (GAUZE/BANDAGES/DRESSINGS) ×1
DERMABOND ADVANCED .7 DNX12 (GAUZE/BANDAGES/DRESSINGS) ×1 IMPLANT
DRSG OPSITE POSTOP 3X4 (GAUZE/BANDAGES/DRESSINGS) ×2 IMPLANT
DURAPREP 26ML APPLICATOR (WOUND CARE) ×2 IMPLANT
GLOVE SURG LTX SZ6.5 (GLOVE) ×2 IMPLANT
GLOVE SURG UNDER POLY LF SZ6.5 (GLOVE) ×2 IMPLANT
GLOVE SURG UNDER POLY LF SZ7 (GLOVE) ×12 IMPLANT
GOWN STRL REUS W/ TWL LRG LVL3 (GOWN DISPOSABLE) ×1 IMPLANT
GOWN STRL REUS W/TWL LRG LVL3 (GOWN DISPOSABLE) ×10 IMPLANT
IV NS IRRIG 3000ML ARTHROMATIC (IV SOLUTION) ×2 IMPLANT
KIT TURNOVER CYSTO (KITS) ×2 IMPLANT
LIGASURE VESSEL 5MM BLUNT TIP (ELECTROSURGICAL) ×2 IMPLANT
MANIPULATOR UTERINE 4.5 ZUMI (MISCELLANEOUS) IMPLANT
NEEDLE INSUFFLATION 120MM (ENDOMECHANICALS) ×2 IMPLANT
NS IRRIG 500ML POUR BTL (IV SOLUTION) ×2 IMPLANT
PACK LAPAROSCOPY BASIN (CUSTOM PROCEDURE TRAY) ×2 IMPLANT
PACK TRENDGUARD 450 HYBRID PRO (MISCELLANEOUS) ×1 IMPLANT
POUCH SPECIMEN RETRIEVAL 10MM (ENDOMECHANICALS) IMPLANT
PROTECTOR NERVE ULNAR (MISCELLANEOUS) ×2 IMPLANT
SCISSORS LAP 5X35 DISP (ENDOMECHANICALS) IMPLANT
SET IRRIG TUBING LAPAROSCOPIC (IRRIGATION / IRRIGATOR) IMPLANT
SET TUBE SMOKE EVAC HIGH FLOW (TUBING) ×2 IMPLANT
SUT VICRYL 0 UR6 27IN ABS (SUTURE) ×2 IMPLANT
SUT VICRYL 4-0 PS2 18IN ABS (SUTURE) ×2 IMPLANT
SYR 5ML LL (SYRINGE) ×2 IMPLANT
SYS RETRIEVAL 5MM INZII UNIV (BASKET) ×2
SYSTEM RETRIEVL 5MM INZII UNIV (BASKET) ×1 IMPLANT
TOWEL OR 17X26 10 PK STRL BLUE (TOWEL DISPOSABLE) ×4 IMPLANT
TRAY FOLEY W/BAG SLVR 14FR LF (SET/KITS/TRAYS/PACK) ×2 IMPLANT
TRENDGUARD 450 HYBRID PRO PACK (MISCELLANEOUS) ×2
TROCAR XCEL NON BLADE 8MM B8LT (ENDOMECHANICALS) ×2 IMPLANT
TROCAR XCEL NON-BLD 11X100MML (ENDOMECHANICALS) IMPLANT
TROCAR XCEL NON-BLD 5MMX100MML (ENDOMECHANICALS) ×4 IMPLANT
WARMER LAPAROSCOPE (MISCELLANEOUS) ×2 IMPLANT

## 2021-01-02 NOTE — H&P (Signed)
[name] is an 40 y.o. female.   96E [id number] LMP 11/19/20 with ectopic pregnancy status post failed Methotrexate therapy, now presenting for surgical management. Patient presented to the office for [place] on 3/4 initially for medical management of unplanned pregnancy after failed Plan B. [place] at that time showed a left adnexal mass consistent with ectopic and central YS present. Patient had some spotting but otherwise asymptomatic and sent to MAU at [facility] for MTX therapy. Starting Day 1 beta quant was 5,872. Her Day 4 beta slightly increased to 6,216. Day 7 beta slightly decreased to 5,596 but below the 15% expected decrease after MTX. She had repeat US performed in office with the Day 7 beta yesterday 3/10 where persistent left adnexal mass measuring 2.8 cm with central YS seen. No FF and patient asymptomatic with no evidence of rupture. She was counseled on high chance of failing second dose MTX therapy and ultimately requiring surgical management.   Patient otherwise healthy with no known medical problems and is not on any daily prescription medications. No previous surgeries.  She is not 244% certain that she is done with future fertility and declines bilateral tubal sterilization at this time.   Pertinent Gynecological History:   Menstrual History: Patient's last menstrual period was 11/19/2020.    Past Medical History:  Diagnosis Date  . Ectopic pregnancy 12/26/2020  . Pneumonia 10/20/2011   ON z-pak for 3 days now    Past Surgical History:  Procedure Laterality Date  . FOOT SURGERY  March 2011 and April 2011   Bilateral foot surgery. One in March and one in April    Family History  Problem Relation Age of Onset  . Anesthesia problems Mother   . Hypotension Neg Hx   . Malignant hyperthermia Neg Hx   . Pseudochol deficiency Neg Hx     Social History:  reports that she has been smoking. She has never used smokeless tobacco. She reports that she does not drink alcohol and does not use  drugs.  Allergies: No Known Allergies  No medications prior to admission.    Review of Systems  All other systems reviewed and are negative.   Last menstrual period 11/19/2020. Physical Exam Vitals reviewed.  Constitutional:      Appearance: Normal appearance.  HENT:     Head: Normocephalic.  Cardiovascular:     Rate and Rhythm: Normal rate.  Pulmonary:     Effort: Pulmonary effort is normal.  Abdominal:     Palpations: Abdomen is soft.  Genitourinary:    General: Normal vulva.  Musculoskeletal:        General: Normal range of motion.     Cervical back: Normal range of motion.  Skin:    General: Skin is warm.  Neurological:     General: No focal deficit present.     Mental Status: She is alert.  Psychiatric:        Mood and Affect: Mood normal.        Behavior: Behavior normal.     Results for orders placed or performed during the hospital encounter of 01/02/21 (from the past 24 hour(s))  Resp Panel by RT-PCR (Flu A&B, Covid) Nasopharyngeal Swab     Status: None   Collection Time: 01/02/21  4:58 AM   Specimen: Nasopharyngeal Swab; Nasopharyngeal(NP) swabs in vial transport medium  Result Value Ref Range   SARS Coronavirus 2 by RT PCR NEGATIVE NEGATIVE   Influenza A by PCR NEGATIVE NEGATIVE   Influenza B by  PCR NEGATIVE NEGATIVE    No results found.  Assessment/Plan:  40Y [id number] LMP 11/19/20 with stable ectopic pregnancy s/p failed MTX therapy consented for surgical management with unilateral salpingectomy and removal of ectopic pregnancy  -Patient has been thoroughly counseled on risks of surgery including but not limited to bleeding, infection, damage to surrounding organs, and impact on future fertility, as well as benefits of surgery with definitive management of ectopic -No antibiotics indicated -SCD VTE ppx -Routine preop/postop care -Anticipate DC home pending PACU DC criteria being met. Postop expectations and call parameters have been reviewed with  the patient. Plan for follow up in office in 2 weeks or sooner as needed   [name] A [name] 01/02/2021, 7:02 AM

## 2021-01-02 NOTE — Anesthesia Preprocedure Evaluation (Addendum)
Anesthesia Evaluation  Patient identified by MRN, date of birth, ID band Patient awake    Reviewed: Allergy & Precautions, NPO status , Patient's Chart, lab work & pertinent test results  Airway Mallampati: I  TM Distance: >3 FB Neck ROM: Full    Dental no notable dental hx.    Pulmonary Current Smoker and Patient abstained from smoking.,    Pulmonary exam normal breath sounds clear to auscultation       Cardiovascular negative cardio ROS Normal cardiovascular exam Rhythm:Regular Rate:Normal     Neuro/Psych negative neurological ROS  negative psych ROS   GI/Hepatic negative GI ROS, Neg liver ROS,   Endo/Other  negative endocrine ROS  Renal/GU negative Renal ROS     Musculoskeletal negative musculoskeletal ROS (+)   Abdominal (+) + obese,   Peds  Hematology negative hematology ROS (+)   Anesthesia Other Findings ectopic pregnancy  Reproductive/Obstetrics                            Anesthesia Physical Anesthesia Plan  ASA: II  Anesthesia Plan: General   Post-op Pain Management:    Induction: Intravenous  PONV Risk Score and Plan: 3 and Ondansetron, Dexamethasone, Midazolam, Scopolamine patch - Pre-op and Treatment may vary due to age or medical condition  Airway Management Planned: Oral ETT  Additional Equipment:   Intra-op Plan:   Post-operative Plan: Extubation in OR  Informed Consent: I have reviewed the patients History and Physical, chart, labs and discussed the procedure including the risks, benefits and alternatives for the proposed anesthesia with the patient or authorized representative who has indicated his/her understanding and acceptance.     Dental advisory given  Plan Discussed with: CRNA  Anesthesia Plan Comments:        Anesthesia Quick Evaluation

## 2021-01-02 NOTE — Discharge Instructions (Signed)
NO TYLENOL UNTIL AFTER 1:40 PM TODAY.  NO ADVIL, ALEVE, IBUPROFEN, NAPROXEN UNTIL AFTER 3:45 PM THIS AFTERNOON.   Post Anesthesia Home Care Instructions  Activity: Get plenty of rest for the remainder of the day. A responsible individual must stay with you for 24 hours following the procedure.  For the next 24 hours, DO NOT: -Drive a car -Advertising copywriter -Drink alcoholic beverages -Take any medication unless instructed by your physician -Make any legal decisions or sign important papers.  Meals: Start with liquid foods such as gelatin or soup. Progress to regular foods as tolerated. Avoid greasy, spicy, heavy foods. If nausea and/or vomiting occur, drink only clear liquids until the nausea and/or vomiting subsides. Call your physician if vomiting continues.  Special Instructions/Symptoms: Your throat may feel dry or sore from the anesthesia or the breathing tube placed in your throat during surgery. If this causes discomfort, gargle with warm salt water. The discomfort should disappear within 24 hours.  If you had a scopolamine patch placed behind your ear for the management of post- operative nausea and/or vomiting:  1. The medication in the patch is effective for 72 hours, after which it should be removed.  Wrap patch in a tissue and discard in the trash. Wash hands thoroughly with soap and water. 2. You may remove the patch earlier than 72 hours if you experience unpleasant side effects which may include dry mouth, dizziness or visual disturbances. 3. Avoid touching the patch. Wash your hands with soap and water after contact with the patch.     Diagnostic Laparoscopy, Care After The following information offers guidance on how to care for yourself after your procedure. Your health care provider may also give you more specific instructions. If you have problems or questions, contact your health care provider. What can I expect after the procedure? After the procedure, it is  common to have:  Mild discomfort in the abdomen.  Sore throat. Women who have laparoscopy with a pelvic examination may have mild cramping and fluid coming from the vagina for a few days after the procedure. Follow these instructions at home: Medicines  Take over-the-counter and prescription medicines only as told by your health care provider.  If you were prescribed an antibiotic medicine, take it as told by your health care provider. Do not stop taking the antibiotic even if you start to feel better.  Ask your health care provider if the medicine prescribed to you: ? Requires you to avoid driving or using machinery. ? Can cause constipation. You may need to take these actions to prevent or treat constipation:  Drink enough fluid to keep your urine pale yellow.  Take over-the-counter or prescription medicines.  Eat foods that are high in fiber, such as beans, whole grains, and fresh fruits and vegetables.  Limit foods that are high in fat and processed sugars, such as fried or sweet foods. Incision care  Follow instructions from your health care provider about how to take care of your incisions. Make sure you: ? Wash your hands with soap and water for at least 20 seconds before and after you change your bandage (dressing). If soap and water are not available, use hand sanitizer. ? Change your dressing as told by your health care provider. ? Leave stitches (sutures), skin glue, or surgical tape in place. These skin closures may need to stay in place for 2 weeks or longer. If surgical tape edges start to loosen and curl up, you may trim the loose edges. Do  not remove the surgical tape completely unless your health care provider tells you to do that.  Check your incision areas every day for signs of infection. Check for: ? Redness, swelling, or pain. ? Fluid or blood. ? Warmth. ? Pus or a bad smell.   Activity  Return to your normal activities as told by your health care provider.  Ask your health care provider what activities are safe for you.  Do not lift anything that is heavier than 10 lb (4.5 kg), or the limit that you are told, until your health care provider says that it is safe.  Avoid sitting for a long time without moving. Get up to take short walks every 1-2 hours. This is important to improve blood flow and breathing. Ask for help if you feel weak or unsteady. General instructions  Do not use any products that contain nicotine or tobacco. These products include cigarettes, chewing tobacco, and vaping devices, such as e-cigarettes. If you need help quitting, ask your health care provider.  If you were given a sedative during the procedure, it can affect you for several hours. Do not drive or operate machinery until your health care provider says that it is safe.  Do not take baths, swim, or use a hot tub until your health care provider approves. Ask your health care provider if you may take showers. You may only be allowed to take sponge baths.  Keep all follow-up visits. This is important. Contact a health care provider if:  You develop shoulder pain.  You feel light-headed or faint.  You are unable to pass gas or have a bowel movement.  You feel nauseous or you vomit.  You develop a rash.  You have any of these signs of infection: ? Redness, swelling, or pain around an incision. ? Fluid or blood coming from an incision. ? Warmth coming from an incision. ? Pus or a bad smell coming from an incision. ? A fever or chills. Get help right away if:  You have severe pain.  You have vomiting that does not go away.  You have heavy bleeding from the vagina.  Any incision opens up.  You have trouble breathing.  You have chest pain. These symptoms may represent a serious problem that is an emergency. Do not wait to see if the symptoms will go away. Get medical help right away. Call your local emergency services (911 in the [place] Do not drive  yourself to the hospital. Summary  After the procedure, it is common to have mild discomfort in the abdomen and a sore throat.  Check your incision areas every day for signs of infection.  Return to your normal activities as told by your health care provider. Ask your health care provider what activities are safe for you. This information is not intended to replace advice given to you by your health care provider. Make sure you discuss any questions you have with your health care provider. Document Revised: 06/06/2020 Document Reviewed: 06/06/2020 Elsevier Patient Education  2021 [employer].

## 2021-01-02 NOTE — Transfer of Care (Signed)
Immediate Anesthesia Transfer of Care Note  Patient: [name]  Procedure(s) Performed: DIAGNOSTIC LAPAROSCOPY WITH REMOVAL OF ECTOPIC PREGNANCY (Left Abdomen)  Patient Location: PACU  Anesthesia Type:General  Level of Consciousness: awake, alert , oriented and patient cooperative  Airway & Oxygen Therapy: Patient Spontanous Breathing and Patient connected to nasal cannula oxygen  Post-op Assessment: Report given to RN and Post -op Vital signs reviewed and stable  Post vital signs: Reviewed and stable  Last Vitals:  Vitals Value Taken Time  BP    Temp    Pulse 101 01/02/21 1013  Resp 14 01/02/21 1013  SpO2 100 % 01/02/21 1013  Vitals shown include unvalidated device data.  Last Pain:  Vitals:   01/02/21 0724  TempSrc:   PainSc: 3       Patients Stated Pain Goal: 4 (01/02/21 0724)  Complications: No complications documented.

## 2021-01-02 NOTE — Anesthesia Postprocedure Evaluation (Signed)
Anesthesia Post Note  Patient: [name]  Procedure(s) Performed: DIAGNOSTIC LAPAROSCOPY WITH REMOVAL OF ECTOPIC PREGNANCY (Left Abdomen)     Patient location during evaluation: PACU Anesthesia Type: General Level of consciousness: awake Pain management: pain level controlled Vital Signs Assessment: post-procedure vital signs reviewed and stable Respiratory status: spontaneous breathing, nonlabored ventilation, respiratory function stable and patient connected to nasal cannula oxygen Cardiovascular status: blood pressure returned to baseline and stable Postop Assessment: no apparent nausea or vomiting Anesthetic complications: no   No complications documented.  Last Vitals:  Vitals:   01/02/21 1100 01/02/21 1143  BP: 103/64 104/61  Pulse: 89 73  Resp: 15 16  Temp:  36.7 C  SpO2: 99% 100%    Last Pain:  Vitals:   01/02/21 1143  TempSrc:   PainSc: 4                  Leib Elahi P Tayelor Osborne

## 2021-01-02 NOTE — Brief Op Note (Signed)
01/02/2021  9:58 AM  PATIENT:  [name]  41 y.o. female  PRE-OPERATIVE DIAGNOSIS:  ectopic pregnancy  POST-OPERATIVE DIAGNOSIS:  ectopic pregnancy, left  PROCEDURE:  Procedure(s): DIAGNOSTIC LAPAROSCOPY WITH REMOVAL OF ECTOPIC PREGNANCY (Left)  SURGEON:  Surgeon(s) and Role:    * Ankush Gintz A, DO - Primary    * Shea Evans, MD - Assisting  PHYSICIAN ASSISTANT: n/a  ANESTHESIA:   general  EBL:  20 mL   BLOOD ADMINISTERED:none  DRAINS: Urinary Catheter (Foley) 150 cc output  IVF: 1100 cc crystalloids  LOCAL MEDICATIONS USED:  MARCAINE     SPECIMEN:  Source of Specimen:  left fallopian tube and ectopic pregnancy  DISPOSITION OF SPECIMEN:  PATHOLOGY  COUNTS:  YES  TOURNIQUET:  * No tourniquets in log *  DICTATION: .Note written in EPIC  PLAN OF CARE: Discharge to home after PACU  PATIENT DISPOSITION:  PACU - hemodynamically stable.   Delay start of Pharmacological VTE agent (>24hrs) due to surgical blood loss or risk of bleeding: no  [name] 01/02/21 10:00 AM

## 2021-01-02 NOTE — Anesthesia Procedure Notes (Signed)
Procedure Name: Intubation Date/Time: 01/02/2021 8:57 AM Performed by: Georgeanne Nim, CRNA Pre-anesthesia Checklist: Patient identified, Emergency Drugs available, Suction available, Patient being monitored and Timeout performed Patient Re-evaluated:Patient Re-evaluated prior to induction Oxygen Delivery Method: Circle system utilized Preoxygenation: Pre-oxygenation with 100% oxygen Induction Type: IV induction Ventilation: Mask ventilation without difficulty Laryngoscope Size: Mac and 4 Grade View: Grade I Tube type: Oral Tube size: 7.0 mm Number of attempts: 1 Airway Equipment and Method: Stylet Placement Confirmation: breath sounds checked- equal and bilateral,  CO2 detector,  positive ETCO2 and ETT inserted through vocal cords under direct vision Secured at: 20 cm Tube secured with: Tape Dental Injury: Teeth and Oropharynx as per pre-operative assessment

## 2021-01-05 ENCOUNTER — Encounter (HOSPITAL_BASED_OUTPATIENT_CLINIC_OR_DEPARTMENT_OTHER): Payer: Self-pay | Admitting: Obstetrics and Gynecology

## 2021-01-05 LAB — SURGICAL PATHOLOGY

## 2021-01-15 NOTE — Op Note (Signed)
[name] 07-05-1981 [id number]  Late Entry Note Date of Service: 01/02/21  Operative Note  PROCEDURE: laparoscopic bilateral salpingectomy  PRE-OPERATIVE DIAGNOSIS: desires permanent female sterilization  POST-OPERATIVE DIAGNOSIS: desires permanent female sterilization  SURGEON: Dr. Clance Boll, DO  ASSISTANT: Dr. Shea Evans, MD  FINDINGS: pelvic exam reveals normal external female genitalia, multiparous ectocervix without lesions, laparoscopy reveals female pelvic anatomy with enlarged left fallopian tube with ectopic pregnancy partially protruding from the distal end, normal appearing right fallopian tube, normal appearing bilateral ovaries, normal appearing uterus  SPECIMENS: left fallopian tube and ectopic pregnancy  EBL: 20 cc  FLUIDS: 1100 cc crystalloids  COMPLICATIONS: None  PROCEDURE IN DETAIL:   After the patient was appropriately consented in the holding area, she was taken to the operating room where general anesthesia was administered without complications. The patient was placed in the dorsal lithotomy position. Bilateral arms were tucked with the appropriate barrier padding. The patient was prepped and draped in the usual sterile fashion. The bladder was trained with a catheter. An appropriate time out was performed that verified the correct patient, procedure, and surgical team.   Pelvic exam performed. A sterile speculum was inserted into the vagina and the cervix was visualized. A single tooth tenaculum was used to grasp the anterior lip of the cervix. A Humi uterine manipulator was inserted through the cervix and secured into place. Attention was turned to the abdomen.  A scalpel was used to make a small vertical skin incision just inferior to the umbilicus. The Veress needle was advanced through the skin incision until 2 clicks were appreciated. The gas was connected and turned on and a low opening pressure was noted. Pneumoperitoneum was achieved without  complications. A 5 mm laparoscope and trocar sleeve was inserted through the umbilical incision and the abdomen was entered under direct visualization. Inspection of the abdomen revealed the findings as noted above and no obvious injuries noted. The patient was placed in Trendelenburg positioning to facilitate pelvic visualization. Lower quadrant ports were placed bilaterally at points approximately 2 cm superior and 2 cm medial to the ASIS. A 5 mm trocar was placed on the right and an 8 mm trocar on the left, both under direct laparoscopic visualization. Attention was turned to the left side where the ectopic pregnancy was noted to be partially expulsion from the fimbriated ends. Small amount of hemoperitoneum noted (10-20 cc). The LigaSure device was used to sequentially cauterize and cut the mesosalpinx just inferior to the fallopian tube and then transected at the cornual end. A 5 mm laparoscopic specimen retrieval bag was introduced through the 8 mm port and the left fallopian tube with ectopic placed inside the bag and removed under laparoscopic visualization. The specimens were sent for final pathology. Excellent hemostasis was noted at the surgical site. The pressure was reduced and the patient laid flat and continued hemostasis noted. The gas was released from the abdomen. The trocars were removed under laparoscopic visualization. The skin incisions were closed with 4-0 Vicryl and skin glue. Local anesthetic using 0.25% Marcaine was injected at the incision sites. The uterine manipulator and Foley catheter was removed. The patient tolerated the procedure well and was taken to the recovery room in stable condition. All lap, needle, and instrument counts were correct. Blood type O POS.  Catrinia Racicot A Hermon Zea 01/15/21 4:51 PM

## 2022-10-04 ENCOUNTER — Other Ambulatory Visit: Payer: Self-pay | Admitting: Physician Assistant

## 2022-10-04 DIAGNOSIS — Z1231 Encounter for screening mammogram for malignant neoplasm of breast: Secondary | ICD-10-CM

## 2022-12-08 ENCOUNTER — Ambulatory Visit
Admission: RE | Admit: 2022-12-08 | Discharge: 2022-12-08 | Disposition: A | Discharge status: Home / Self Care | Payer: 59 | Source: Ambulatory Visit | Attending: Physician Assistant | Admitting: Physician Assistant

## 2022-12-08 DIAGNOSIS — Z1231 Encounter for screening mammogram for malignant neoplasm of breast: Secondary | ICD-10-CM
